# Patient Record
Sex: Male | Born: 2000
Health system: Southern US, Community
[De-identification: ages and names within clinical notes are randomized; demographics above are authoritative.]

---
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
## 2020-05-16 ENCOUNTER — Ambulatory Visit: Payer: Self-pay | Attending: Internal Medicine

## 2020-05-16 ENCOUNTER — Other Ambulatory Visit: Payer: Self-pay

## 2020-05-16 DIAGNOSIS — Z23 Encounter for immunization: Secondary | ICD-10-CM

## 2020-05-16 NOTE — Progress Notes (Signed)
   Covid-19 Vaccination Clinic  Name:  Jonathan Solis    MRN: [id number] DOB: 2001/11/28  05/16/2020  [name] was observed post Covid-19 immunization for 15 minutes without incident. He was provided with Vaccine Information Sheet and instruction to access the V-Safe system.   [name] was instructed to call 911 with any severe reactions post vaccine: Marland Kitchen Difficulty breathing  . Swelling of face and throat  . A fast heartbeat  . A bad rash all over body  . Dizziness and weakness   Immunizations Administered    Name Date Dose VIS Date Route   Pfizer COVID-19 Vaccine 05/16/2020  6:16 PM 0.3 mL 02/16/2019 Intramuscular   Manufacturer: [employer], [employer]   Lot: [id number]   NDC: [address]-3

## 2020-06-06 ENCOUNTER — Ambulatory Visit: Payer: Self-pay | Attending: Internal Medicine

## 2020-06-06 DIAGNOSIS — Z23 Encounter for immunization: Secondary | ICD-10-CM

## 2020-06-06 NOTE — Progress Notes (Signed)
   Covid-19 Vaccination Clinic  Name:  Jonathan Solis    MRN: [id number] DOB: 2001-10-10  06/06/2020  [name] was observed post Covid-19 immunization for 15 minutes without incident. He was provided with Vaccine Information Sheet and instruction to access the V-Safe system.   [name] was instructed to call 911 with any severe reactions post vaccine: Marland Kitchen Difficulty breathing  . Swelling of face and throat  . A fast heartbeat  . A bad rash all over body  . Dizziness and weakness   Immunizations Administered    Name Date Dose VIS Date Route   Pfizer COVID-19 Vaccine 06/06/2020  5:17 PM 0.3 mL 02/16/2019 Intramuscular   Manufacturer: [employer], [employer]   Lot: [id number]   NDC: [address]-7

## 2021-05-01 DIAGNOSIS — G8929 Other chronic pain: Secondary | ICD-10-CM | POA: Diagnosis not present

## 2021-05-01 DIAGNOSIS — Z13220 Encounter for screening for lipoid disorders: Secondary | ICD-10-CM | POA: Diagnosis not present

## 2021-05-01 DIAGNOSIS — M25561 Pain in right knee: Secondary | ICD-10-CM | POA: Diagnosis not present

## 2021-05-01 DIAGNOSIS — Z131 Encounter for screening for diabetes mellitus: Secondary | ICD-10-CM | POA: Diagnosis not present

## 2021-05-01 DIAGNOSIS — Z0001 Encounter for general adult medical examination with abnormal findings: Secondary | ICD-10-CM | POA: Diagnosis not present

## 2021-05-10 DIAGNOSIS — M25561 Pain in right knee: Secondary | ICD-10-CM | POA: Diagnosis not present

## 2021-05-29 ENCOUNTER — Other Ambulatory Visit: Payer: Self-pay

## 2021-05-29 ENCOUNTER — Ambulatory Visit: Payer: 59 | Attending: Orthopedic Surgery

## 2021-05-29 DIAGNOSIS — M25561 Pain in right knee: Secondary | ICD-10-CM | POA: Diagnosis not present

## 2021-05-29 NOTE — Therapy (Deleted)
[place] [facility] PHYSICAL AND SPORTS MEDICINE [address], [place], [address] Phone: [phone]   Fax:  [phone]  Physical Therapy Evaluation  Patient Details  Name: Jonathan Solis MRN: [id number] Date of Birth: 06/19/2001 Referring Provider (PT): Lasandra Beech   Encounter Date: 05/29/2021    History reviewed. No pertinent past medical history.  History reviewed. No pertinent surgical history.  There were no vitals filed for this visit.    Subjective Assessment - 05/29/21 1350    Pertinent History Pt is a 20 y.o. male referred to PT for acute to subacute R knee pain that has been going on for the last few weeks. No pertinent PMH. Pain located on lateral aspect of his knee described as sharp that is worsened with weightbearing, knee AROM, and running. Unable to run > 1/4 mile due to pain. Popping of R knee reported but no locking or giving way. Has imaging of R knee with no concerns of fractures or other abnormalities. Pt initially denies previous hx of knee pain. Pain began May 8th training for half marathon when running ~8 miles. Pt reports running with R foot externally rotated and is wondering if his running mechanics is effecting it. Rest has been helpful and pain has been improving. When pain was at its worst, stopped running and elevated which helped. Pain located on lat aspect outside of patella. Described as sharp pain during swing phase of gait and terminal stance with push off. Worst pain: 8/10, current pain: 0/10, best pain:  0/10. Pt reports taking close to whole day to reduce from 8/10. Painful movements also with lateral lunges and movements. Pt's main goal with PT is to find exercises above and below knee.    Currently in Pain? No/denies              [facility] PT Assessment - 05/29/21 0001      Assessment   Medical Diagnosis R knee pain    Referring Provider (PT) Lasandra Beech    Onset Date/Surgical Date 04/29/21    Next  MD Visit None      Restrictions   Weight Bearing Restrictions No      Prior Function   Level of Independence Independent    Vocation Student    Vocation Requirements school/work full time      Cognition   Overall Cognitive Status Within Functional Limits for tasks assessed            PAIN: 0/10 NPS  POSTURE:   PROM/AROM:    AROM BLE:  Hip flexion:  Knee flexion:  Knee extension:   Accessory Motions:    STRENGTH:  Graded on a 0-5 scale Muscle Group Left Right  Shoulder flex /5 /5  Shoulder Abd /5 /5  Shoulder Ext /5 /5  Shoulder IR/ER /5 /5  Elbow /5 /5  Wrist/hand /5 /5  Hip Flex 5/5 5/5  Hip Abd 5/5 5/5  Hip Add 5/5 5/5  Hip Ext    Hip IR/ER 5/5 5/5  Knee Flex 5/5 5/5  Knee Ext 5/5 5/5  Ankle DF 5/5 5/5  Ankle PF 5/5 5/5   SENSATION:  BUE :  BLE :   NEUROLOGICAL SCREEN: (2+ unless otherwise noted.) N=normal  Ab=abnormal   Level Dermatome R L  C3 Anterior Neck  N N  C4 Top of Shoulder N N  C5 Lateral Upper Arm  N N  C6 Lateral Arm/ Thumb  N N  C7 Middle Finger  N  N  C8 4th & 5th Finger N N  T1 Medial Arm N N  L2 Medial thigh/groin N N  L3 Lower thigh/med.knee N N  L4 Medial leg/lat thigh N N  L5 Lat. leg & dorsal foot N N  S1 post/lat foot/thigh/leg N N  S2 Post./med. thigh & leg N N    SOMATOSENSORY:  Any N & T in extremities or weakness: reports :         Sensation           Intact      Diminished         Absent  Light touch LEs                              COORDINATION: Finger to Nose: Dysmetric ... Past Pointing:  ... Slow speed and moderate pass pointing ...  intact without pass pointing, but slow speed ...         Heel Shin Slide Test:   SPECIAL TESTS: Cranial Nerves/Vision   FUNCTIONAL MOBILITY:   BALANCE: Static Sitting Balance  Normal Able to maintain balance against maximal resistance   Good Able to maintain balance against moderate resistance   Good-/Fair+ Accepts minimal resistance   Fair Able to sit  unsupported without balance loss and without UE support   Poor+ Able to maintain with Minimal assistance from individual or chair   Poor Unable to maintain balance-requires mod/max support from individual or chair    Static Standing Balance  Normal Able to maintain standing balance against maximal resistance   Good Able to maintain standing balance against moderate resistance   Good-/Fair+ Able to maintain standing balance against minimal resistance   Fair Able to stand unsupported without UE support and without LOB for 1-2 min   Fair- Requires Min A and UE support to maintain standing without loss of balance   Poor+ Requires mod A and UE support to maintain standing without loss of balance   Poor Requires max A and UE support to maintain standing balance without loss    Dynamic Sitting Balance  Normal Able to sit unsupported and weight shift across midline maximally   Good Able to sit unsupported and weight shift across midline moderately   Good-/Fair+ Able to sit unsupported and weight shift across midline minimally   Fair Minimal weight shifting ipsilateral/front, difficulty crossing midline   Fair- Reach to ipsilateral side and unable to weight shift   Poor + Able to sit unsupported with min A and reach to ipsilateral side, unable to weight shift   Poor Able to sit unsupported with mod A and reach ipsilateral/front-can't cross midline    Standing Dynamic Balance  Normal Stand independently unsupported, able to weight shift and cross midline maximally   Good Stand independently unsupported, able to weight shift and cross midline moderately   Good-/Fair+ Stand independently unsupported, able to weight shift across midline minimally   Fair Stand independently unsupported, weight shift, and reach ipsilaterally, loss of balance when crossing midline   Poor+ Able to stand with Min A and reach ipsilaterally, unable to weight shift   Poor Able to stand with Mod A and minimally reach  ipsilaterally, unable to cross midline.      GAIT:  OUTCOME MEASURES: TEST Outcome Interpretation  5 times sit<>stand sec >60 yo, >15 sec indicates increased risk for falls  10 meter walk test  m/s <1.0 m/s indicates increased risk for falls; limited community ambulator  Timed up and Go                 sec <14 sec indicates increased risk for falls  6 minute walk test                Feet 1000 feet is community Financial controller  <36/56 (100% risk for falls), 37-45 (80% risk for falls); 46-51 (>50% risk for falls); 52-55 (lower risk <25% of falls)  FOTO                Objective measurements completed on examination: See above findings.                             Patient will benefit from skilled therapeutic intervention in order to improve the following deficits and impairments:     Visit Diagnosis: No diagnosis found.     Problem List There are no problems to display for this patient.   Delphia Grates Fairly, IV 05/29/2021, 1:56 PM  [place] [facility] PHYSICAL AND SPORTS MEDICINE [address], [place], [address] Phone: [phone]   Fax:  [phone]  Name: Jonathan Solis MRN: [id number] Date of Birth: [birth year]

## 2021-05-29 NOTE — Therapy (Signed)
[facility]ICINE [address] [place], [address] Phone: [phone]   Fax:  [phone]  Physical Therapy Evaluation  Patient Details  Name: JULIS HAUBNER MRN: [id number] Date of Birth: [birth year] Referring Provider (PT): Lasandra Beech   Encounter Date: 05/29/2021   PT End of Session - 05/29/21 2025    Visit Number 1    Number of Visits 17    Date for PT Re-Evaluation 07/24/21    PT Start Time 1345    PT Stop Time 1430    PT Time Calculation (min) 45 min    Activity Tolerance Patient tolerated treatment well    Behavior During Therapy [facility] for tasks assessed/performed           History reviewed. No pertinent past medical history.  History reviewed. No pertinent surgical history.  There were no vitals filed for this visit.    Subjective Assessment - 05/29/21 1350    Subjective Pt is a 20 y.o. male referred to PT for acute R knee pain exacerbated with running. 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 Noticed pain with half marathon training at 8 mile mark.    Pertinent History Pt is a 20 y.o. male referred to PT for acute to subacute R knee pain that has been going on for the last few weeks. No pertinent PMH. Pain located on lateral aspect of his knee described as sharp that is worsened with weightbearing, knee AROM, and running. Unable to run > 1/4 mile due to pain. Popping of R knee reported but no locking or giving way. Has imaging of R knee with no concerns of fractures or other abnormalities. Pt initially denies previous hx of knee pain. Pain began May 8th training for half marathon when running ~8 miles. Pt reports running with R foot externally rotated and is wondering if his running mechanics is effecting it. Rest has been helpful and pain has been improving. When pain was at its worst, stopped running and elevated which helped. Pain located  on lat aspect outside of patella. Described as sharp pain during swing phase of gait and terminal stance with push off. Worst pain: 8/10, current pain: 0/10, best pain:  0/10. Pt reports taking close to whole day to reduce from 8/10. Painful movements also with lateral lunges and movements. Pt's main goal with PT is to find exercises above and below knee.    Limitations Standing;Other (comment)   Running   How long can you sit comfortably? Unlimited    How long can you stand comfortably? As long as needed    How long can you walk comfortably? Unlimited    Diagnostic tests X-ray    Patient Stated Goals Improve LE flexibility, pain, be able to run pain free    Currently in Pain? No/denies              [facility] PT Assessment - 05/29/21 0001      Assessment   Medical Diagnosis R knee pain    Referring Provider (PT) Lasandra Beech    Onset Date/Surgical Date 04/29/21    Next MD Visit None      Restrictions   Weight Bearing Restrictions No      Prior Function   Level of Independence Independent    Vocation Student    Vocation Requirements school/work full time      Cognition   Overall Cognitive  Status Within Functional Limits for tasks assessed            OBJECTIVE  MUSCULOSKELETAL: Tremor: Absent Bulk: Normal Tone: Normal, no spasticity, rigidity, or clonus No trophic changes noted to lower extremities. No ecchymosis, erythema, or edema noted around knee. No gross knee deformity noted  Posture No gross abnormalities noted in standing or seated posture  Lumbar/Hip AROM: WFL and painless with overpressure in all planes  Gait No gross deficits in gait identified  Palpation TTP along lat aspect of R patella. Post running gait assessment pt reports TTP at patellar tendon.  Strength R/L 5/5 Hip flexion 5/5 Hip external rotation 5/5 Hip internal rotation 5/5 Hip abduction 5/5 Hip adduction 5/5 Hip extension 5/5 Knee extension 5/5 Knee flexion 5/5 Ankle  Dorsiflexion 5/5 Ankle Plantarflexion 5/5 Ankle Inversion 5/5 Ankle Eversion 5/5 R hip TFL *indicates pain  AROM  Knee R/L Flexion: 130/130  Extension: 0/0 *indicates pain  Hip R/L Flexion: WFL Extension: WFL Abduction: WFL *indicates pain  Ankle R/L WNL Ankle Plantarflexion WNL Ankle Dorsiflexion WNL Ankle Inversion WNL Ankle Eversion *Indicates Pain  Muscle Length Hamstring length: Limited. < 90 degrees bilaterally. Quad length [facility]): Negative. Hip Flexor length Maisie Fus): Concordant pain on RLE for rec fem bias. IT band length Claiborne Rigg): Negative  Passive Accessory Motion Superior Tibiofibular Joint: WNL Knee: WNL Patella: WNL   NEUROLOGICAL:  Mental Status Patient's fund of knowledge is within normal limits for educational level.  Sensation Grossly intact to light touch bilateral LEs as determined by testing dermatomes L2-S2 Proprioception and hot/cold testing deferred on this date   SPECIAL TESTS  Ligamentous Stability  Anterior Drawer Test: NEGATIVE Lachman Test: NEGATIVE Posterior Drawer Test: NEGATIVE Valgus Stress Test: NEGATIVE Varus Stress Test: NEGATIVE  Meniscus Tests  McMurray Test: NEGATIVE SPECIAL TESTS   Ligamentous Integrity Anterior Drawer: NEGATIVE Posterior Drawer: NEGATIVE  FUNCTIONAL TESTS  Sit to stand: NEGATIVE Deep squat: NEGATIVE   Running Gait Assessment Pt runs with consistent cadence. Noted heel strike bilaterally with intermittent mid foot striking. Pt through stance and push off phase is supinated throughout. Equal hip extension and flight phase in BLE's. Pain improved after cuing to push through big toes and perform initial contact with toe strike.     Objective measurements completed on examination: See above findings.               PT Education - 05/29/21 2024    Education Details POC. HEP. Running gait mechanics adjustments.    Person(s) Educated Patient    Methods  Explanation;Demonstration;Tactile cues;Verbal cues;Handout    Comprehension Verbalized understanding            PT Short Term Goals - 05/29/21 2034      PT SHORT TERM GOAL #1   Title Pt will be indep with HEP to improve flexibility and pain with running.    Baseline 6/7: initiated    Time 4    Period Weeks    Status New    Target Date 06/26/21      PT SHORT TERM GOAL #2   Title Pt will complete 1 mile of running without experiencing knee pain > 2/10 NPS.    Baseline 6/7: Up to 8/10 NPS with running    Time 2    Period Weeks    Status New    Target Date 06/12/21             PT Long Term Goals - 05/29/21 2036      PT LONG  TERM GOAL #1   Title Pt will improve FOTO to target score to display improvements in functional mobility.    Baseline 6/7: 65/86    Time 8    Period Weeks    Status New    Target Date 07/24/21      PT LONG TERM GOAL #2   Title Pt will complete 4 miles of running without experiencing an increase in R knee pain.    Baseline 6/7: Up to 8/10 pain NPS.    Time 8    Period Weeks    Status New    Target Date 07/24/21                  Plan - 05/29/21 2025    Clinical Impression Statement Pt is a 20 y.o. male with acute R knee pain with no significant PMH. Pt has TTP at lateral patella near joint line and post running is TTP at patellar tendon that was not appreciated until end of session. All meniscul, ACL, PCL, LCL, MCL testing negative. Pt has full strength in BLE's. Positive THomas test noted bilaterally with rec fem bias with concordant pain and also noted limited hamstring mobility and B hip ER/IR tightness with lat lean from pt to compensate along with calf tightness. Pt displays pain free squat and lateral lunge with no concordant symptoms. Running gait assessed on treadmill. Pt appears to land with heel strike to mid foot bilaterally and runs with supinated feet. Equal strides and consistent cadence appreciated. With cuing to push off through  big toes and run on balls of feet pt does reports light improvement in R knee pain. These impairments are limiting pt to perform leisure and recreational running tasks along with prolonged standing needed for machinist school. Pt will benefit from skilled PT services to address pain, LE flexibility, and running mechanics so pt can return to PLOF pain free.    Personal Factors and Comorbidities Age;Education;Profession;Past/Current Experience;Fitness;Time since onset of injury/illness/exacerbation    Examination-Activity Limitations Locomotion Level   Running   Examination-Participation Restrictions School;Occupation    Stability/Clinical Decision Making Stable/Uncomplicated    Clinical Decision Making Low    Rehab Potential Good    PT Frequency 2x / week    PT Duration 8 weeks    PT Treatment/Interventions ADLs/Self Care Home Management;Aquatic Therapy;Cryotherapy;Electrical Stimulation;Iontophoresis 4mg /ml Dexamethasone;Moist Heat;Traction;Ultrasound;Gait training;Stair training;Functional mobility training;Therapeutic activities;Patient/family education;Therapeutic exercise;Balance training;Neuromuscular re-education;Passive range of motion;Dry needling;Manual techniques;Spinal Manipulations;Joint Manipulations    PT Next Visit Plan Reassess HEP, running mechanics, hip flexibility.    PT Home Exercise Plan Access Code:    Consulted and Agree with Plan of Care Patient           [facility] PT Assessment - 05/29/21 0001      Assessment   Medical Diagnosis R knee pain    Referring Provider (PT) 07/29/21    Onset Date/Surgical Date 04/29/21    Next MD Visit None      Restrictions   Weight Bearing Restrictions No      Prior Function   Level of Independence Independent    Vocation Student    Vocation Requirements school/work full time      Cognition   Overall Cognitive Status Within Functional Limits for tasks assessed          Patient will benefit from skilled therapeutic  intervention in order to improve the following deficits and impairments:  Pain,Improper body mechanics,Decreased mobility,Impaired flexibility  Visit Diagnosis: Acute pain of right knee  Problem List There are no problems to display for this patient.   Delphia GratesMilton M. Fairly IV, PT, DPT Physical Therapist- [place]  [facility]  05/29/2021, 8:48 PM  [facility] [address] [place], [place], [address] Phone: [phone]   Fax:  [phone]  Name: Tiana Loftzekiel C Wymore MRN: [id number] Date of Birth: 01/29/2001

## 2021-05-31 ENCOUNTER — Other Ambulatory Visit: Payer: Self-pay

## 2021-05-31 ENCOUNTER — Ambulatory Visit: Payer: 59

## 2021-05-31 DIAGNOSIS — M25561 Pain in right knee: Secondary | ICD-10-CM | POA: Diagnosis not present

## 2021-05-31 NOTE — Therapy (Signed)
[facility]TS MEDICINE [address], [place], [address] Phone: [phone]   Fax:  [phone]  Physical Therapy Treatment  Patient Details  Name: Jonathan Solis MRN: [id number] Date of Birth: [birth year] Referring Provider (PT): Lasandra Beech   Encounter Date: 05/31/2021   PT End of Session - 05/31/21 1455     Visit Number 2    Number of Visits 17    Date for PT Re-Evaluation 07/24/21    PT Start Time 1430    PT Stop Time 1513    PT Time Calculation (min) 43 min    Activity Tolerance Patient tolerated treatment well;No increased pain    Behavior During Therapy [facility] for tasks assessed/performed             History reviewed. No pertinent past medical history.  History reviewed. No pertinent surgical history.  There were no vitals filed for this visit.   Subjective Assessment - 05/31/21 1446     Subjective Pt is a 20 yo reports that he experienced increased pain in right knee after doing lateral lunges and forward lunges with right knee being in back of left knee.    Currently in Pain? Yes    Pain Score 4     Pain Location Knee    Pain Orientation Right    Pain Descriptors / Indicators Aching    Pain Type Acute pain    Pain Onset 1 to 4 weeks ago    Pain Frequency Intermittent    Aggravating Factors  Exercising    Pain Relieving Factors Rest    Effect of Pain on Daily Activities Limits exercises    Multiple Pain Sites No             THEREX   Pigeon Stretch 3 x 30 sec/LE  Half Kneel Stretch with foot on wall 3 x 30 sec/LE Supine Hamstring with Strap 3 x 30 sec/LE  -VCs to position leg   RLE single leg squats with emphasis on eccentric control: 1x8 Seated RLE LAQ eccentric into GTB: x10. VC's for prolonged eccentric loading  Gait Mechanics with running on treadmill:  Settings: 7 miles per hour, 8 min  -VCs to increase forefoot strike due to continuous heel strike placing increased load on  knee joint.  -VCs to increase forward lean   Pt continues to report R knee pain up to 4/10 NPS with no changes in pain with running gait modifications.    Pt educated on reducing LE loading with runs and gym work outs for pain and inflammation to subside. Pt educated on possible benefits of ice massage localized to patellar tendon for 3-5 min bouts as needed when experiencing pain.   PT Education - 05/31/21 1452     Education Details Educated on home exercise plan    Person(s) Educated Patient    Methods Verbal cues;Explanation;Demonstration              PT Short Term Goals - 05/29/21 2034       PT SHORT TERM GOAL #1   Title Pt will be indep with HEP to improve flexibility and pain with running.    Baseline 6/7: initiated    Time 4    Period Weeks    Status New    Target Date 06/26/21      PT SHORT TERM GOAL #2   Title Pt will complete 1 mile of running without experiencing knee pain > 2/10 NPS.  Baseline 6/7: Up to 8/10 NPS with running    Time 2    Period Weeks    Status New    Target Date 06/12/21               PT Long Term Goals - 05/29/21 2036       PT LONG TERM GOAL #1   Title Pt will improve FOTO to target score to display improvements in functional mobility.    Baseline 6/7: 65/86    Time 8    Period Weeks    Status New    Target Date 07/24/21      PT LONG TERM GOAL #2   Title Pt will complete 4 miles of running without experiencing an increase in R knee pain.    Baseline 6/7: Up to 8/10 pain NPS.    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 Pt has signs and symptoms indicative of patellar tendinopathy with increased pain well localized to lower patellar tendon, decreased quad and hamstring activity, and that is elicited with physical activity.  He  tolerated session well with no increase in pain during strengthening and stretchning exercises. Pt did experience increase in right knee toward end of running activity which was likely due to increased heel strike, right leg external rotation, and decreased anterior trunk lean. Pt instructed on proper running techniques and to decrease LE activity. He will continue benefit from PT to address muscle length deficits, pain with activity, and running mechanics, so that will be able to engage recreational physical activity without being limited by pain.    Personal Factors and Comorbidities Age;Education;Profession;Past/Current Experience;Fitness;Time since onset of injury/illness/exacerbation    Examination-Activity Limitations Locomotion Level   Running   Examination-Participation Restrictions School;Occupation    Stability/Clinical Decision Making Stable/Uncomplicated    Rehab Potential Good    PT Frequency 2x / week    PT Duration 8 weeks    PT Treatment/Interventions ADLs/Self Care Home Management;Aquatic Therapy;Cryotherapy;Electrical Stimulation;Iontophoresis 4mg /ml Dexamethasone;Moist Heat;Traction;Ultrasound;Gait training;Stair training;Functional mobility training;Therapeutic activities;Patient/family education;Therapeutic exercise;Balance training;Neuromuscular re-education;Passive range of motion;Dry needling;Manual techniques;Spinal Manipulations;Joint Manipulations    PT Next Visit Plan Reassess HEP, Assess Running Mechanics, Progress Eccentric Exercises    PT Home Exercise Plan Access Code:    Consulted and Agree with Plan of Care Patient             Visit Diagnosis: Acute pain of right knee   Problem List There are no problems to display for this patient.  [email or web address] PT, DPT  Physical Therapist- [facility]  05/31/2021, 5:01 PM  [facility] PHYSICAL AND SPORTS MEDICINE [address], [address], [place] Phone: [phone]   Fax:  [phone]  Name: Jonathan Solis MRN: [name] Date of Birth: [birth year]

## 2021-06-05 ENCOUNTER — Encounter: Payer: Self-pay | Admitting: Physical Therapy

## 2021-06-05 ENCOUNTER — Ambulatory Visit: Payer: 59 | Admitting: Physical Therapy

## 2021-06-05 DIAGNOSIS — M25561 Pain in right knee: Secondary | ICD-10-CM | POA: Diagnosis not present

## 2021-06-05 NOTE — Therapy (Signed)
[facility] PH[facility]ORTS MEDICINE [address] [place], [address] Phone: [phone]   Fax:  [phone]  Physical Therapy Treatment  Patient Details  Name: Jonathan Solis MRN: [id number] Date of Birth: Jan 14, 2001 Referring Provider (PT): Lasandra Beech   Encounter Date: 06/05/2021   PT End of Session - 06/05/21 1505     Visit Number 3    Number of Visits 17    Date for PT Re-Evaluation 07/24/21    PT Start Time 1415    PT Stop Time 1500    PT Time Calculation (min) 45 min    Activity Tolerance Patient tolerated treatment well;No increased pain    Behavior During Therapy [facility] for tasks assessed/performed             History reviewed. No pertinent past medical history.  History reviewed. No pertinent surgical history.  There were no vitals filed for this visit.   Subjective Assessment - 06/05/21 1416     Subjective Pt reports that he is still experiencing pain in his right knee around knee cap. He has descreased his activity. He experiencings most of his pain in right joint line. Experiences pain now with walking playing disc golf over uneven terrain or when lunging backwards.    Patient Stated Goals To be able to complete sports exercises and running long distances without experiencing pain.    Currently in Pain? Yes    Pain Score 2     Pain Location Knee    Pain Orientation Right    Pain Descriptors / Indicators Aching    Pain Type Acute pain    Pain Onset 1 to 4 weeks ago             THEREX:  Knee Extension #35 with emphasis on eccentric phase 2 x 10   Knee Side Step Ups off 6 inch step 2 x10  -Slight increase in pain with knee extension   Supine TFL Stretch 5 x 30 sec   Sidelying Foam Roller of TFL 5 x 30 sec   Standing Calf Stretch 30 sec  -Educated patient on alternative stretch        PT Short Term Goals - 06/05/21 1507       PT SHORT TERM GOAL #1   Title Pt will be indep with HEP to  improve flexibility and pain with running.    Baseline 6/7: initiated    Time 4    Period Weeks    Status New    Target Date 06/26/21      PT SHORT TERM GOAL #2   Title Pt will complete 1 mile of running without experiencing knee pain > 2/10 NPS.    Baseline 6/7: Up to 8/10 NPS with running    Time 2    Period Weeks    Status New    Target Date 06/12/21               PT Long Term Goals - 06/05/21 1507       PT LONG TERM GOAL #1   Title Pt will improve FOTO to target score to display improvements in functional mobility.    Baseline 6/7: 65/86    Time 8    Period Weeks    Status New      PT LONG TERM GOAL #2   Title Pt will complete 4 miles of running without experiencing an increase in R knee pain.    Baseline 6/7: Up to  8/10 pain NPS.    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 Pt continues to feel pain with certain activities particularly when walking over uneven surfaces. He tolerated most exercises without increase in pain with exception of lateral step ups, but this was only slight increase in pain within lateral joint line. Noted during session is soft tissue on pt's lateral joint line when he performs knee extension and this occurs on both his knees. Pt will continue to benefit from PT to progress his knee strength and mobility, so that he can resume normal running activities without experiencing an increase in his pain.    Personal Factors and Comorbidities Age;Education;Profession;Past/Current Experience;Fitness;Time since onset of injury/illness/exacerbation    Examination-Activity Limitations Locomotion Level   Running   Examination-Participation Restrictions School;Occupation    Stability/Clinical Decision Making Stable/Uncomplicated    Rehab Potential Good    PT Frequency 2x / week     PT Duration 8 weeks    PT Treatment/Interventions ADLs/Self Care Home Management;Aquatic Therapy;Cryotherapy;Electrical Stimulation;Iontophoresis 4mg /ml Dexamethasone;Moist Heat;Traction;Ultrasound;Gait training;Stair training;Functional mobility training;Therapeutic activities;Patient/family education;Therapeutic exercise;Balance training;Neuromuscular re-education;Passive range of motion;Dry needling;Manual techniques;Spinal Manipulations;Joint Manipulations    PT Next Visit Plan Assess pain tolerance to energy loading exercises    PT Home Exercise Plan Access Code:    Consulted and Agree with Plan of Care Patient             HEP includes following exercises:  Access Code: [email or web address] URL: [email or web address] Date: 06/05/2021 Prepared by: 06/07/2021  Exercises Half Kneeling Hip Flexor Stretch - 2 x daily - 7 x weekly - 2 sets - 2 reps - 30 hold Gastroc Stretch on Wall - 2 x daily - 7 x weekly - 2 sets - 2 reps - 30 hold Long Sitting Calf Stretch with Strap - 2 x daily - 7 x weekly - 2 sets - 2 reps - 30 hold Pigeon Stretch with TRX - 1 x daily - 7 x weekly - 2 sets - 2 reps - 30 hold Single Leg Squat with Chair Touch - 1 x daily - 3 x weekly - 2 sets - 8 reps Seated Knee Extension with Resistance - 1 x daily - 3 x weekly - 3 sets - 10 reps Sidelying TFL Stretch - 1 x daily - 7 x weekly - 1 sets - 5 reps - 30 hold Lateral Step Up - 1 x daily - 7 x weekly - 3 sets - 10 reps   Patient will benefit from skilled therapeutic intervention in order to improve the following deficits and impairments:  Pain, Improper body mechanics, Decreased mobility, Impaired flexibility  Visit Diagnosis: Acute pain of right knee     Problem List There are no problems to display for this patient.  Ellin Goodie 06/05/2021, 3:23 PM  06/07/2021 PT, DPT    [facility] PHYSICAL AND SPORTS MEDICINE [address], [place] Phone: [phone]   Fax:  [phone]  Name: Jonathan Solis MRN: [name] Date of Birth: December 14, 2001

## 2021-06-07 ENCOUNTER — Ambulatory Visit: Payer: 59 | Admitting: Physical Therapy

## 2021-06-12 ENCOUNTER — Encounter: Payer: Self-pay | Admitting: Physical Therapy

## 2021-06-12 ENCOUNTER — Other Ambulatory Visit: Payer: Self-pay

## 2021-06-12 ENCOUNTER — Ambulatory Visit: Payer: 59 | Admitting: Physical Therapy

## 2021-06-12 DIAGNOSIS — M25561 Pain in right knee: Secondary | ICD-10-CM | POA: Diagnosis not present

## 2021-06-12 NOTE — Therapy (Signed)
[place] [facility] PHYSICAL AND SPORTS MEDICINE [address], [place], [address] Phone: [phone]   Fax:  [phone]  Physical Therapy Treatment  Patient Details  Name: Jonathan Solis MRN: [id number] Date of Birth: 06-Feb-2001 Referring Provider (PT): Lasandra Beech   Encounter Date: 06/12/2021   PT End of Session - 06/12/21 1401     Visit Number 4    Number of Visits 17    Date for PT Re-Evaluation 07/24/21    PT Start Time 1330    PT Stop Time 1400    PT Time Calculation (min) 30 min    Activity Tolerance Patient tolerated treatment well;No increased pain    Behavior During Therapy [facility] for tasks assessed/performed             History reviewed. No pertinent past medical history.  History reviewed. No pertinent surgical history.  There were no vitals filed for this visit.   Subjective Assessment - 06/12/21 1328     Subjective Pt reports that he is still experiencing knee pain. He went for run and played disc golf. During disc golf, he walked 20,000 steps. He describes dailing it back, but he is still doing a significant amount of lower body activity. Pt explains that he tends to experience more pain with more activity. During run, he did not notice pain in knee during run just after run. He wonders how much he should push through pain with exercise.    Patient Stated Goals To be able to complete sports exercises and running long distances without experiencing pain.    Currently in Pain? No/denies    Pain Onset 1 to 4 weeks ago            THEREX:  Instruction and shared decision making on activity modification.  -Agreed to stop disc golf and to bike instead of run.   Right Knee Isometric 3 x 45 sec -TC to push knee into hand   Wall Squat on RLE 3 x 45 sec  -Vcs to maintain tension in quads      Updated HEP and educated patient on changes to exercises and addition of new exercises       PT Short Term Goals -  06/12/21 1400       PT SHORT TERM GOAL #1   Title Pt will be indep with HEP to improve flexibility and pain with running.    Baseline 6/7: initiated    Time 4    Period Weeks    Status New    Target Date 06/26/21      PT SHORT TERM GOAL #2   Title Pt will complete 1 mile of running without experiencing knee pain > 2/10 NPS.    Baseline 6/7: Up to 8/10 NPS with running    Time 2    Period Weeks    Status New    Target Date 06/12/21               PT Long Term Goals - 06/12/21 1401       PT LONG TERM GOAL #1   Title Pt will improve FOTO to target score to display improvements in functional mobility.    Baseline 6/7: 65/86    Time 8    Period Weeks    Status New      PT LONG TERM GOAL #2   Title Pt will complete 4 miles of running without experiencing an increase in R knee pain.  Baseline 6/7: Up to 8/10 pain NPS.    Time 8    Period Weeks    Status New                Plan - 06/12/21 1418     Clinical Impression Statement Pt continues to experience right knee pain over patellar tendon likely due to continued over activity. Pt is still exercising at a high volume despite recommendation to reduce physical activity. PT engaged with pt in developing a plan to modify activity by stopping disc golf and biking instead of running. Strengthening exercises were also changed from isotonic to isometric to decrease right knee overactivty and to begin patellar tendinopathy protocol before moving to isotonics. Pt is in agreement with plan of care, and he will continue to benefit from skilled PT to reduce right knee pain in order to resume running.    Personal Factors and Comorbidities Age;Education;Profession;Past/Current Experience;Fitness;Time since onset of injury/illness/exacerbation    Examination-Activity Limitations Locomotion Level   Running   Examination-Participation Restrictions School;Occupation    Stability/Clinical Decision Making Stable/Uncomplicated    Rehab  Potential Good    PT Frequency 2x / week    PT Duration 8 weeks    PT Treatment/Interventions ADLs/Self Care Home Management;Aquatic Therapy;Cryotherapy;Electrical Stimulation;Iontophoresis 4mg /ml Dexamethasone;Moist Heat;Traction;Ultrasound;Gait training;Stair training;Functional mobility training;Therapeutic activities;Patient/family education;Therapeutic exercise;Balance training;Neuromuscular re-education;Passive range of motion;Dry needling;Manual techniques;Spinal Manipulations;Joint Manipulations    PT Next Visit Plan Sequence and instruct on appropriate way to do exercises    PT Home Exercise Plan Access Code:    Consulted and Agree with Plan of Care Patient            Home exercises include:  Access Code: [email or web address] URL: [email or web address] Date: 06/12/2021 Prepared by: 06/14/2021  Exercises Half Kneeling Hip Flexor Stretch - 2 x daily - 7 x weekly - 2 sets - 2 reps - 30 hold Gastroc Stretch on Wall - 2 x daily - 7 x weekly - 2 sets - 2 reps - 30 hold Long Sitting Calf Stretch with Strap - 2 x daily - 7 x weekly - 2 sets - 2 reps - 30 hold Pigeon Stretch with TRX - 1 x daily - 7 x weekly - 2 sets - 2 reps - 30 hold Sidelying TFL Stretch - 1 x daily - 7 x weekly - 1 sets - 5 reps - 30 hold Supine Quad Set - 1 x daily - 7 x weekly - 3 sets - 5 reps - 45 sec hold Single Leg Quarter Squat with Swiss Ball at Ellin Goodie - 1 x daily - 7 x weekly - 3 sets - 5 reps - 45 sec hold    Patient will benefit from skilled therapeutic intervention in order to improve the following deficits and impairments:  Pain, Improper body mechanics, Decreased mobility, Impaired flexibility  Visit Diagnosis: Acute pain of right knee     Problem List There are no problems to display for this patient.  [employer] PT, DPT  06/12/2021, 2:27 PM  [facility] PHYSICAL AND SPORTS MEDICINE [address], [address], [place] Phone:  [phone]   Fax:  [phone]  Name: Jonathan Solis MRN: [name] Date of Birth: 06-10-2001

## 2021-06-14 ENCOUNTER — Ambulatory Visit: Payer: 59 | Admitting: Physical Therapy

## 2021-06-19 ENCOUNTER — Ambulatory Visit: Payer: 59 | Admitting: Physical Therapy

## 2021-06-19 DIAGNOSIS — M25561 Pain in right knee: Secondary | ICD-10-CM

## 2021-06-19 NOTE — Therapy (Signed)
[place] [facility] PHYSICAL AND SPORTS MEDICINE [address] [place], [address] Phone: [phone]   Fax:  [phone]  Physical Therapy Treatment  Patient Details  Name: ROGELIO WAYNICK MRN: [id number] Date of Birth: 30-Sep-2001 Referring Provider (PT): Lasandra Beech   Encounter Date: 06/19/2021   PT End of Session - 06/19/21 1349     Visit Number 5    Number of Visits 17    Date for PT Re-Evaluation 07/24/21    PT Start Time 1330    PT Stop Time 1415    PT Time Calculation (min) 45 min    Activity Tolerance Patient tolerated treatment well;No increased pain    Behavior During Therapy [facility] for tasks assessed/performed             No past medical history on file.  No past surgical history on file.  There were no vitals filed for this visit.   Subjective Assessment - 06/19/21 1333     Subjective Pt reports that his knee pain has been feeling better as of late. He is using a resistance bike for aerobic activity and cut back on playing frisbee golf.    Patient Stated Goals To be able to complete sports exercises and running long distances without experiencing pain.    Currently in Pain? No/denies    Pain Onset 1 to 4 weeks ago            THEREX:  Single Leg Press #105 2 x 15   Single Leg Knee Extension #35 2 x 15   Bulgarian Split Squat #30 2 x 15  -Vcs to maintain toes and knees pointed forward   PT Short Term Goals - 06/19/21 1346       PT SHORT TERM GOAL #1   Title Pt will be indep with HEP to improve flexibility and pain with running.    Baseline 6/7: initiated    Time 4    Period Weeks    Status New    Target Date 06/26/21      PT SHORT TERM GOAL #2   Title Pt will complete 1 mile of running without experiencing knee pain > 2/10 NPS.    Baseline 6/7: Up to 8/10 NPS with running    Time 2    Period Weeks    Status New    Target Date 06/12/21               PT Long Term Goals - 06/19/21 1346        PT LONG TERM GOAL #1   Title Pt will improve FOTO to target score to display improvements in functional mobility.    Baseline 6/7: 65/86    Time 8    Period Weeks    Status New      PT LONG TERM GOAL #2   Title Pt will complete 4 miles of running without experiencing an increase in R knee pain.    Baseline 6/7: Up to 8/10 pain NPS.    Time 8    Period Weeks    Status New            Access Code: [email or web address] URL: [email or web address] Date: 06/19/2021 Prepared by: Ellin Goodie  Exercises Half Kneeling Hip Flexor Stretch - 2 x daily - 7 x weekly - 2 sets - 2 reps - 30 hold Gastroc Stretch on Wall - 2 x daily - 7 x weekly - 2 sets - 2 reps - 30 hold Long  Sitting Calf Stretch with Strap - 2 x daily - 7 x weekly - 2 sets - 2 reps - 30 hold Pigeon Stretch with TRX - 1 x daily - 7 x weekly - 2 sets - 2 reps - 30 hold Sidelying TFL Stretch - 1 x daily - 7 x weekly - 1 sets - 5 reps - 30 hold Supine Quad Set - 1 x daily - 7 x weekly - 3 sets - 5 reps - 45 sec hold Single Leg Quarter Squat with Swiss Ball at [employer] - 1 x daily - 7 x weekly - 3 sets - 5 reps - 45 sec hold Single Leg Knee Extension with Weight Machine - 1 x daily - 2 x weekly - 2 sets - 15 reps Single Leg Press - 1 x daily - 2 x weekly - 2 sets - 15 reps  Single Leg Split Squat with 30 lb weight 2 x 15 x 2 days per week        Plan - 06/19/21 1411     Clinical Impression Statement Pt presents for follow-up for right pain secondary to right knee patellar tendinopathy. He demonstrates decreased pain from prior week's activity modification. Pt able to progress to isotonic strengthening phase of protocol from isometric without increasing his right knee pain. He was limited to 2 sets instead of 3 sets because of right quadriceps muscle fatigue. He will benefit from skilled PT to progress right knee strength exercises in order to decrease right knee pain and to return to distance running without experiencing  increased pain.    Personal Factors and Comorbidities Age;Education;Profession;Past/Current Experience;Fitness;Time since onset of injury/illness/exacerbation    Examination-Activity Limitations Locomotion Level   Running   Examination-Participation Restrictions School;Occupation    Stability/Clinical Decision Making Stable/Uncomplicated    Rehab Potential Good    PT Frequency 2x / week    PT Duration 8 weeks    PT Treatment/Interventions ADLs/Self Care Home Management;Aquatic Therapy;Cryotherapy;Electrical Stimulation;Iontophoresis 4mg /ml Dexamethasone;Moist Heat;Traction;Ultrasound;Gait training;Stair training;Functional mobility training;Therapeutic activities;Patient/family education;Therapeutic exercise;Balance training;Neuromuscular re-education;Passive range of motion;Dry needling;Manual techniques;Spinal Manipulations;Joint Manipulations    PT Next Visit Plan Sequence and instruct on appropriate way to do exercises    PT Home Exercise Plan Access Code:    Consulted and Agree with Plan of Care Patient             Patient will benefit from skilled therapeutic intervention in order to improve the following deficits and impairments:  Pain, Improper body mechanics, Decreased mobility, Impaired flexibility  Visit Diagnosis: Acute pain of right knee  Problem List There are no problems to display for this patient.  [id number] PT, DPT  06/19/2021, 2:19 PM  [place] [facility] PHYSICAL AND SPORTS MEDICINE [address], [address], [place] Phone: [phone]   Fax:  [phone]  Name: KASHIF POOLER MRN: [name] Date of Birth: 2001-04-01

## 2021-06-21 ENCOUNTER — Ambulatory Visit: Payer: 59 | Admitting: Physical Therapy

## 2021-06-21 DIAGNOSIS — M25561 Pain in right knee: Secondary | ICD-10-CM | POA: Diagnosis not present

## 2021-06-21 NOTE — Therapy (Signed)
[place] [facility] PHYSICAL AND SPORTS MEDICINE [address] [place], [address] Phone: [phone]   Fax:  [phone]  Physical Therapy Treatment  Patient Details  Name: Jonathan Solis MRN: [id number] Date of Birth: 09/30/2001 Referring Provider (PT): Jonathan Solis   Encounter Date: 06/21/2021   PT End of Session - 06/22/21 0947     Visit Number 6    Number of Visits 17    Date for PT Re-Evaluation 07/24/21    PT Start Time 1545    PT Stop Time 1630    PT Time Calculation (min) 45 min    Activity Tolerance Patient tolerated treatment well;No increased pain    Behavior During Therapy [facility] for tasks assessed/performed             No past medical history on file.  No past surgical history on file.  There were no vitals filed for this visit.   Subjective Assessment - 06/21/21 1549     Subjective Pt reports that he does not feel pain walking around, but that he does feel it during work outs. He does notice that he does not have as much pain after workouts.    Currently in Pain? No/denies            THEREX:   Single Leg Press #105 2 x 15   Single Leg Knee Extension #35 2 x 15  -Needed rest break after 8   Bulgarian Split Squat #30 2 x 15 -Vcs to maintain toes and knees pointed forward and to keep weight on left side to center himself  Education on using patella stap to decrease pain during activity.   Marching Bridges 1 X 10   Single Leg RDL 1 x 10   Updated HEP and educated patient on changes to exercises and addition of new exercises      PT Short Term Goals - 06/22/21 0952       PT SHORT TERM GOAL #1   Title Pt will be indep with HEP to improve flexibility and pain with running.    Baseline 6/7: initiated    Time 4    Period Weeks    Status New    Target Date 06/26/21      PT SHORT TERM GOAL #2   Title Pt will complete 1 mile of running without experiencing knee pain > 2/10 NPS.    Baseline 6/7: Up  to 8/10 NPS with running    Time 2    Period Weeks    Status New    Target Date 06/12/21               PT Long Term Goals - 06/22/21 0952       PT LONG TERM GOAL #1   Title Pt will improve FOTO to target score to display improvements in functional mobility.    Baseline 6/7: 65/86    Time 8    Period Weeks    Status New      PT LONG TERM GOAL #2   Title Pt will complete 4 miles of running without experiencing an increase in R knee pain.    Baseline 6/7: Up to 8/10 pain NPS.    Time 8    Period Weeks    Status New                Plan - 06/22/21 0951     Clinical Impression Statement Pt presents for follow-up for right pain secondary  to right knee patellar tendinopathy. He demonstrates decreased pain from prior week's activity modification and isotonic protocol. He is consisitently experiencing a 3/10 after strenous activity which is a decrease from 4/10 from last session. Pt educated on addtional hamstring exercises to add to home exercises to balance quad heavy exercise regiment. PT recommended use of patellar strap on right knee to see if this relieves pain. He will continue to benefit from skilled PT to decrease right knee pain with activity and to return to running prolonged distances.    Personal Factors and Comorbidities Age;Education;Profession;Past/Current Experience;Fitness;Time since onset of injury/illness/exacerbation    Examination-Activity Limitations Locomotion Level   Running   Examination-Participation Restrictions School;Occupation    Stability/Clinical Decision Making Stable/Uncomplicated    Rehab Potential Good    PT Frequency 2x / week    PT Duration 8 weeks    PT Treatment/Interventions ADLs/Self Care Home Management;Aquatic Therapy;Cryotherapy;Electrical Stimulation;Iontophoresis 4mg /ml Dexamethasone;Moist Heat;Traction;Ultrasound;Gait training;Stair training;Functional mobility training;Therapeutic activities;Patient/family education;Therapeutic  exercise;Balance training;Neuromuscular re-education;Passive range of motion;Dry needling;Manual techniques;Spinal Manipulations;Joint Manipulations    PT Next Visit Plan Progress to 3 set max on isotonic portion of protocol, assess knee pain with single leg squat on incline.    PT Home Exercise Plan Access Code:    Consulted and Agree with Plan of Care Patient            HEP includes the following:   Access Code: [email or web address] URL: [email or web address] Date: 06/21/2021 Prepared by: 06/23/2021  Exercises Half Kneeling Hip Flexor Stretch - 2 x daily - 7 x weekly - 2 sets - 2 reps - 30 hold Gastroc Stretch on Wall - 2 x daily - 7 x weekly - 2 sets - 2 reps - 30 hold Long Sitting Calf Stretch with Strap - 2 x daily - 7 x weekly - 2 sets - 2 reps - 30 hold Pigeon Stretch with TRX - 1 x daily - 7 x weekly - 2 sets - 2 reps - 30 hold Sidelying TFL Stretch - 1 x daily - 7 x weekly - 1 sets - 5 reps - 30 hold Supine Quad Set - 1 x daily - 7 x weekly - 3 sets - 5 reps - 45 sec hold Single Leg Quarter Squat with Swiss Ball at Ellin Goodie - 1 x daily - 7 x weekly - 3 sets - 5 reps - 45 sec hold Single Leg Knee Extension with Weight Machine - 1 x daily - 2 x weekly - 2 sets - 15 reps Single Leg Press - 1 x daily - 2 x weekly - 2 sets - 15 reps Marching Bridge - 1 x daily - 3 x weekly - 3 sets - 10 reps Forward T with Counter Support - 1 x daily - 3 x weekly - 3 sets - 10 reps   Patient will benefit from skilled therapeutic intervention in order to improve the following deficits and impairments:  Pain, Improper body mechanics, Decreased mobility, Impaired flexibility  Visit Diagnosis: Acute pain of right knee     Problem List There are no problems to display for this patient.  [employer] PT, DPT  06/22/2021, 9:56 AM  [facility] PHYSICAL AND SPORTS MEDICINE [address], [place] Phone: [phone]   Fax:   [phone]  Name: Jonathan Solis MRN: Jonathan Solis Date of Birth: 29-Nov-2001

## 2021-06-26 ENCOUNTER — Other Ambulatory Visit: Payer: Self-pay

## 2021-06-26 ENCOUNTER — Ambulatory Visit: Payer: 59 | Attending: Orthopedic Surgery | Admitting: Physical Therapy

## 2021-06-26 DIAGNOSIS — M25561 Pain in right knee: Secondary | ICD-10-CM | POA: Diagnosis not present

## 2021-06-26 NOTE — Therapy (Signed)
[facility] PHYSICAL AND SPORTS MEDICINE [address], [place], [address] Phone: [phone]   Fax:  [phone]  Physical Therapy Treatment  Patient Details  Name: Jonathan Solis MRN: [id number] Date of Birth: 06/03/2001 Referring Provider (PT): Lasandra Beech   Encounter Date: 06/26/2021    No past medical history on file.  No past surgical history on file.  There were no vitals filed for this visit.   Subjective Assessment - 06/26/21 1334     Subjective Pt reports that he hyperextended knee over weekend playing disc golf and he had some pain but it has subsidized over time.    Pertinent History Pt is a 20 y.o. male referred to PT for acute to subacute R knee pain that has been going on for the last few weeks. No pertinent PMH. Pain located on lateral aspect of his knee described as sharp that is worsened with weightbearing, knee AROM, and running. Unable to run > 1/4 mile due to pain. Popping of R knee reported but no locking or giving way. Has imaging of R knee with no concerns of fractures or other abnormalities. Pt initially denies previous hx of knee pain. Pain began May 8th training for half marathon when running ~8 miles. Pt reports running with R foot externally rotated and is wondering if his running mechanics is effecting it. Rest has been helpful and pain has been improving. When pain was at its worst, stopped running and elevated which helped. Pain located on lat aspect outside of patella. Described as sharp pain during swing phase of gait and terminal stance with push off. Worst pain: 8/10, current pain: 0/10, best pain:  0/10. Pt reports taking close to whole day to reduce from 8/10. Painful movements also with lateral lunges and movements. Pt's main goal with PT is to find exercises above and below knee.    Limitations Standing    How long can you sit comfortably? Unlimited    How long can you stand comfortably? As long as  needed    How long can you walk comfortably? Unlimited    Diagnostic tests X ray    Patient Stated Goals To be able to complete sports exercises and running long distances without experiencing pain.    Pain Score 3     Pain Location Knee    Pain Orientation Right    Pain Descriptors / Indicators Aching    Pain Type Acute pain    Pain Onset More than a month ago    Pain Frequency Intermittent    Aggravating Factors  Exercising    Pain Relieving Factors Rest    Effect of Pain on Daily Activities Limits exercise            THEREX:   Single Leg Press #105 3 x 15   Single Leg Knee Extension #35 3 x 15  -Needed rest breaks throughout exercises    Bulgarian Split Squat #30 3 x 15 -Vcs to maintain toes and knees pointed forward and to keep weight on left side to center himself   No increase in pain after performing exercises.      PT Short Term Goals - 06/22/21 4536       PT SHORT TERM GOAL #1   Title Pt will be indep with HEP to improve flexibility and pain with running.    Baseline 6/7: initiated    Time 4    Period Weeks    Status New  Target Date 06/26/21      PT SHORT TERM GOAL #2   Title Pt will complete 1 mile of running without experiencing knee pain > 2/10 NPS.    Baseline 6/7: Up to 8/10 NPS with running    Time 2    Period Weeks    Status New    Target Date 06/12/21               PT Long Term Goals - 06/22/21 0952       PT LONG TERM GOAL #1   Title Pt will improve FOTO to target score to display improvements in functional mobility.    Baseline 6/7: 65/86    Time 8    Period Weeks    Status New      PT LONG TERM GOAL #2   Title Pt will complete 4 miles of running without experiencing an increase in R knee pain.    Baseline 6/7: Up to 8/10 pain NPS.    Time 8    Period Weeks    Status New                   Plan - 06/26/21 1449     Clinical Impression Statement Pt presents for follow-up for right pain secondary to right  knee patellar tendinopathy. He exhibits an increase in his right knee strength with an increase in the number of sets from 2 to 3 without experiencing an increase in his pain from the beginnning of session. He will continue to following isotonic protocol of completing 3 sets of 15 repitition max and then progress to 3 sets of 6 RM.  Pt will continue to benefit from skilled PT to decrease right knee pain in order to return to long distance running.    Personal Factors and Comorbidities Age;Education;Profession;Past/Current Experience;Fitness;Time since onset of injury/illness/exacerbation    Examination-Activity Limitations Locomotion Level   Running   Examination-Participation Restrictions School;Occupation    Stability/Clinical Decision Making Stable/Uncomplicated    Rehab Potential Good    PT Frequency 2x / week    PT Duration 8 weeks    PT Treatment/Interventions ADLs/Self Care Home Management;Aquatic Therapy;Cryotherapy;Electrical Stimulation;Iontophoresis 4mg /ml Dexamethasone;Moist Heat;Traction;Ultrasound;Gait training;Stair training;Functional mobility training;Therapeutic activities;Patient/family education;Therapeutic exercise;Balance training;Neuromuscular re-education;Passive range of motion;Dry needling;Manual techniques;Spinal Manipulations;Joint Manipulations    PT Next Visit Plan Continue 3 set x 15 RM on isotonic portion of protocol, assess knee pain with single leg squat on incline.    PT Home Exercise Plan Access Code:    Consulted and Agree with Plan of Care Patient             Patient will benefit from skilled therapeutic intervention in order to improve the following deficits and impairments:  Pain, Improper body mechanics, Decreased mobility, Impaired flexibility  Visit Diagnosis: Acute pain of right knee   Problem List There are no problems to display for this patient.  [id number] PT, DPT   06/26/2021, 3:30 PM  [place] [facility] PHYSICAL AND SPORTS MEDICINE [address], [place] Phone: [phone]   Fax:  [phone]  Name: KORTLAND NICHOLS MRN: [name] Date of Birth: September 07, 2001

## 2021-06-28 ENCOUNTER — Ambulatory Visit: Payer: 59 | Admitting: Physical Therapy

## 2021-06-28 DIAGNOSIS — M25561 Pain in right knee: Secondary | ICD-10-CM | POA: Diagnosis not present

## 2021-06-28 NOTE — Therapy (Addendum)
[facility] PH[facility] AND [facility]OR[facility] MEDICINE [address], [place], [address] Phone: [phone]   Fax:  [phone]  Physical Therapy Treatment  Patient Details  Name: Jonathan Solis MRN: [id number] Date of Birth: 2001/09/16 Referring Provider (PT): Lasandra Beech   Encounter Date: 06/28/2021   PT End of Session - 06/28/21 1429     Visit Number 8    Number of Visits 17    Date for PT Re-Evaluation 07/24/21    PT Start Time 1330    PT Stop Time 1415    PT Time Calculation (min) 45 min    Activity Tolerance Patient tolerated treatment well;No increased pain    Behavior During Therapy [facility] for tasks assessed/performed             No past medical history on file.  No past surgical history on file.  There were no vitals filed for this visit.   Subjective Assessment - 06/28/21 1416     Subjective Pt reports no change in his pain even after going to the gym earlier to day.    Pertinent History Pt is a 20 y.o. male referred to PT for acute to subacute R knee pain that has been going on for the last few weeks. No pertinent PMH. Pain located on lateral aspect of his knee described as sharp that is worsened with weightbearing, knee AROM, and running. Unable to run > 1/4 mile due to pain. Popping of R knee reported but no locking or giving way. Has imaging of R knee with no concerns of fractures or other abnormalities. Pt initially denies previous hx of knee pain. Pain began May 8th training for half marathon when running ~8 miles. Pt reports running with R foot externally rotated and is wondering if his running mechanics is effecting it. Rest has been helpful and pain has been improving. When pain was at its worst, stopped running and elevated which helped. Pain located on lat aspect outside of patella. Described as sharp pain during swing phase of gait and terminal stance with push off. Worst pain: 8/10, current pain: 0/10, best pain:  0/10. Pt  reports taking close to whole day to reduce from 8/10. Painful movements also with lateral lunges and movements. Pt's main goal with PT is to find exercises above and below knee.    Limitations Standing    How long can you sit comfortably? Unlimited    How long can you stand comfortably? As long as needed    How long can you walk comfortably? Unlimited    Diagnostic tests X ray    Patient Stated Goals To be able to complete sports exercises and running long distances without experiencing pain.    Currently in Pain? Yes    Pain Score 3     Pain Location Knee    Pain Orientation Right    Pain Descriptors / Indicators Aching    Pain Type Acute pain    Pain Onset More than a month ago            THEREX:   Single Leg Press #105 3 x 15  Single Leg Knee Extension #35 3 x 15  -Vcs to adjust machine to increase ROM.    Avoided doing [place] split squats because pt already completed earlier.   Supine Hamstring stretch on wall 2 x 30 sec  -Pt reports difficulty applying pressure   Standing hamstring stretch 2 x 30 sec  -Vcs to maintain hips pointed  forward     PT Short Term Goals - 06/28/21 1417       PT SHORT TERM GOAL #1   Title Pt will be indep with HEP to improve flexibility and pain with running.    Baseline 6/7: initiated    Time 4    Period Weeks    Status New    Target Date 06/26/21      PT SHORT TERM GOAL #2   Title Pt will complete 1 mile of running without experiencing knee pain > 2/10 NPS.    Baseline 6/7: Up to 8/10 NPS with running    Time 2    Period Weeks    Status New    Target Date 06/12/21               PT Long Term Goals - 06/28/21 1418       PT LONG TERM GOAL #1   Title Pt will improve FOTO to target score to display improvements in functional mobility.    Baseline 6/7: 65/86    Time 8    Period Weeks    Status New      PT LONG TERM GOAL #2   Title Pt will complete 4 miles of running without experiencing an increase in R knee pain.     Baseline 6/7: Up to 8/10 pain NPS. 7/7: Has not attempted running    Time 8    Period Weeks    Status New              Plan - 06/28/21 1440     Clinical Impression Statement Pt presents for follow-up for right pain secondary to right patellar tendinopathy. Pt shows limited progress towards goals. However, this is due to activity modifications with restrictions to activity to decrease inflammation. Pt has demonstrated a decrease in pain and progression through protocol. Next session, pt will progress towards energy storage phase with plyometric exercises. He will continue to benefit from skilled PT to decrease right knee pain in order to return to running and increased activity with right knee.    Personal Factors and Comorbidities Age;Education;Profession;Past/Current Experience;Fitness;Time since onset of injury/illness/exacerbation    Examination-Activity Limitations Locomotion Level   Running   Examination-Participation Restrictions School;Occupation    Stability/Clinical Decision Making Stable/Uncomplicated    Rehab Potential Good    PT Frequency 2x / week    PT Duration 8 weeks    PT Treatment/Interventions ADLs/Self Care Home Management;Aquatic Therapy;Cryotherapy;Electrical Stimulation;Iontophoresis 4mg /ml Dexamethasone;Moist Heat;Traction;Ultrasound;Gait training;Stair training;Functional mobility training;Therapeutic activities;Patient/family education;Therapeutic exercise;Balance training;Neuromuscular re-education;Passive range of motion;Dry needling;Manual techniques;Spinal Manipulations;Joint Manipulations    PT Next Visit Plan Test pt on leg press and progress towards exercises with energy storage: single leg hops, split squats, etc.    PT Home Exercise Plan Access Code:    Consulted and Agree with Plan of Care Patient            HEP includes following:  Access Code: [email or web address] URL: [email or web address] Date: 06/28/2021 Prepared by: 08/29/2021  Exercises Half Kneeling Hip Flexor Stretch - 2 x daily - 7 x weekly - 2 sets - 2 reps - 30 hold Gastroc Stretch on Wall - 2 x daily - 7 x weekly - 2 sets - 2 reps - 30 hold Long Sitting Calf Stretch with Strap - 2 x daily - 7 x weekly - 2 sets - 2 reps - 30 hold Pigeon Stretch with TRX - 1 x daily - 7 x weekly - 2 sets -  2 reps - 30 hold Sidelying TFL Stretch - 1 x daily - 7 x weekly - 1 sets - 5 reps - 30 hold Supine Quad Set - 1 x daily - 7 x weekly - 3 sets - 5 reps - 45 sec hold Single Leg Quarter Squat with Swiss Ball at [employer] - 1 x daily - 7 x weekly - 3 sets - 5 reps - 45 sec hold Single Leg Knee Extension with Weight Machine - 1 x daily - 2 x weekly - 2 sets - 15 reps Single Leg Press - 1 x daily - 2 x weekly - 2 sets - 15 reps Marching Bridge - 1 x daily - 3 x weekly - 3 sets - 10 reps Forward T with Counter Support - 1 x daily - 3 x weekly - 3 sets - 10 reps Standing Hamstring Stretch on Chair - 1 x daily - 7 x weekly - 1 sets - 5 reps - 30 hold    Patient will benefit from skilled therapeutic intervention in order to improve the following deficits and impairments:  Pain, Improper body mechanics, Decreased mobility, Impaired flexibility  Visit Diagnosis: Acute pain of right knee    Problem List There are no problems to display for this patient.  Ellin Goodie PT, DPT  06/28/2021, 3:00 PM  [place] [facility] PHYSICAL AND SPORTS MEDICINE [address], [place], [address] Phone: [phone]   Fax:  [phone]  Name: AZURE BARRALES MRN: [id number] Date of Birth: [birth year]

## 2021-07-03 ENCOUNTER — Ambulatory Visit: Payer: 59 | Admitting: Physical Therapy

## 2021-07-03 DIAGNOSIS — M25561 Pain in right knee: Secondary | ICD-10-CM

## 2021-07-03 NOTE — Therapy (Signed)
[place] [facility] PHYSICAL AND SPORTS MEDICINE [address] [place], [address] Phone: [phone]   Fax:  [phone]  Physical Therapy Treatment  Patient Details  Name: Jonathan Solis MRN: [id number] Date of Birth: 04/05/2001 Referring Provider (PT): Lasandra Beech   Encounter Date: 07/03/2021    Subjective Assessment - 07/03/21 1348     Subjective Pt reports tha the has not been experiencing an increase in his right knee even after doing several LE exercises this weekend.    Pertinent History Pt is a 20 y.o. male referred to PT for acute to subacute R knee pain that has been going on for the last few weeks. No pertinent PMH. Pain located on lateral aspect of his knee described as sharp that is worsened with weightbearing, knee AROM, and running. Unable to run > 1/4 mile due to pain. Popping of R knee reported but no locking or giving way. Has imaging of R knee with no concerns of fractures or other abnormalities. Pt initially denies previous hx of knee pain. Pain began May 8th training for half marathon when running ~8 miles. Pt reports running with R foot externally rotated and is wondering if his running mechanics is effecting it. Rest has been helpful and pain has been improving. When pain was at its worst, stopped running and elevated which helped. Pain located on lat aspect outside of patella. Described as sharp pain during swing phase of gait and terminal stance with push off. Worst pain: 8/10, current pain: 0/10, best pain:  0/10. Pt reports taking close to whole day to reduce from 8/10. Painful movements also with lateral lunges and movements. Pt's main goal with PT is to find exercises above and below knee.    Limitations Standing    How long can you sit comfortably? Unlimited    How long can you stand comfortably? As long as needed    How long can you walk comfortably? Unlimited    Diagnostic tests X ray    Patient Stated Goals To be able  to complete sports exercises and running long distances without experiencing pain.    Currently in Pain? No/denies    Pain Onset More than a month ago             THEREX:   Single Leg Press #145 4 x 6    Single Leg Knee Extension #55 4 x 6   -Vcs to adjust machine to increase ROM.   [place] Split Squat #100: 40 lb kettlbell, 30 lb kettle bell, 30 lb backpack 6 x 4   Demonstrated how to complete isometric knee extension exercise on knee extension machine and how to perform a spanish squat for single leg squat isometric exercise.   Educated on completing 6 RM isotonic exercises and isometric exercises on off days.    PT Education - 07/03/21 1338     Education Details form/technique for exercise    Person(s) Educated Patient    Methods Explanation    Comprehension Verbalized understanding;Returned demonstration              PT Short Term Goals - 07/03/21 1352       PT SHORT TERM GOAL #1   Title Pt will be indep with HEP to improve flexibility and pain with running.    Baseline 6/7: initiated    Time 4    Period Weeks    Status New    Target Date 06/26/21      PT SHORT  TERM GOAL #2   Title Pt will complete 1 mile of running without experiencing knee pain > 2/10 NPS.    Baseline 6/7: Up to 8/10 NPS with running    Time 2    Period Weeks    Status New    Target Date 06/12/21               PT Long Term Goals - 07/03/21 1352       PT LONG TERM GOAL #1   Title Pt will improve FOTO to target score to display improvements in functional mobility.    Baseline 6/7: 65/86    Time 8    Period Weeks    Status New      PT LONG TERM GOAL #2   Title Pt will complete 4 miles of running without experiencing an increase in R knee pain.    Baseline 6/7: Up to 8/10 pain NPS. 7/7: Has not attempted running    Time 8    Period Weeks    Status New               Plan - 07/03/21 1353     Clinical Impression Statement Pt presents for f/u for patellar  tendinopathy. He was able to progress to 6 RM portion of isotonic phase of patellar tendinopathy rehab protocol without experiencing an increase in his right knee pain. He will continue to benefit from skilled PT to reduce right knee with activity in order to return to long distance running.    Personal Factors and Comorbidities Age;Education;Profession;Past/Current Experience;Fitness;Time since onset of injury/illness/exacerbation    Examination-Activity Limitations Locomotion Level   Running   Examination-Participation Restrictions School;Occupation    Stability/Clinical Decision Making Stable/Uncomplicated    Rehab Potential Good    PT Frequency 2x / week    PT Duration 8 weeks    PT Treatment/Interventions ADLs/Self Care Home Management;Aquatic Therapy;Cryotherapy;Electrical Stimulation;Iontophoresis 4mg /ml Dexamethasone;Moist Heat;Traction;Ultrasound;Gait training;Stair training;Functional mobility training;Therapeutic activities;Patient/family education;Therapeutic exercise;Balance training;Neuromuscular re-education;Passive range of motion;Dry needling;Manual techniques;Spinal Manipulations;Joint Manipulations    PT Next Visit Plan SLS on incline board determine if there is pain. Test pt on leg press and progress towards exercises with energy storage: single leg hops, split squats, etc.    PT Home Exercise Plan Access Code:    Consulted and Agree with Plan of Care Patient             Patient will benefit from skilled therapeutic intervention in order to improve the following deficits and impairments:  Pain, Improper body mechanics, Decreased mobility, Impaired flexibility  Visit Diagnosis: Acute pain of right knee     Problem List There are no problems to display for this patient.  [id number] PT, DPT  07/03/2021, 2:12 PM  [place] [facility] PHYSICAL AND SPORTS MEDICINE [address], [place] Phone: [phone]   Fax:   [phone]  Name: Jonathan Solis MRN: [name] Date of Birth: 12/18/2001

## 2021-07-05 ENCOUNTER — Ambulatory Visit: Payer: 59 | Admitting: Physical Therapy

## 2021-07-05 DIAGNOSIS — M25561 Pain in right knee: Secondary | ICD-10-CM | POA: Diagnosis not present

## 2021-07-05 NOTE — Therapy (Signed)
[place] [facility] PH[facility]ORTS MEDICINE [address] [place], [address] Phone: [phone]   Fax:  [phone]  Physical Therapy Treatment  Patient Details  Name: Jonathan Solis MRN: [id number] Date of Birth: [birth year] Referring Provider (PT): Lasandra Beech   Encounter Date: 07/05/2021   PT End of Session - 07/05/21 1333     Visit Number 10    Number of Visits 17    Date for PT Re-Evaluation 07/24/21    PT Start Time 1330    PT Stop Time 1415    PT Time Calculation (min) 45 min    Activity Tolerance Patient tolerated treatment well;No increased pain    Behavior During Therapy [facility] for tasks assessed/performed             No past medical history on file.  No past surgical history on file.  There were no vitals filed for this visit.   Subjective Assessment - 07/05/21 1334     Subjective Pt reports that he has not experienced increased pain in knee even after increased activity .    Pertinent History Pt is a 20 y.o. male referred to PT for acute to subacute R knee pain that has been going on for the last few weeks. No pertinent PMH. Pain located on lateral aspect of his knee described as sharp that is worsened with weightbearing, knee AROM, and running. Unable to run > 1/4 mile due to pain. Popping of R knee reported but no locking or giving way. Has imaging of R knee with no concerns of fractures or other abnormalities. Pt initially denies previous hx of knee pain. Pain began May 8th training for half marathon when running ~8 miles. Pt reports running with R foot externally rotated and is wondering if his running mechanics is effecting it. Rest has been helpful and pain has been improving. When pain was at its worst, stopped running and elevated which helped. Pain located on lat aspect outside of patella. Described as sharp pain during swing phase of gait and terminal stance with push off. Worst pain: 8/10, current pain: 0/10,  best pain:  0/10. Pt reports taking close to whole day to reduce from 8/10. Painful movements also with lateral lunges and movements. Pt's main goal with PT is to find exercises above and below knee.    Limitations Standing    How long can you sit comfortably? Unlimited    How long can you stand comfortably? As long as needed    How long can you walk comfortably? Unlimited    Diagnostic tests X ray    Patient Stated Goals To be able to complete sports exercises and running long distances without experiencing pain.    Pain Onset More than a month ago             Single Leg Press #165 4 x 6    Single Leg Knee Extension #65 4 x 6     [place] Split Squat #100: 40 lb kettlbell, 30 lb kettle bell, 30 lb backpack 6 x 4     Educated on completing 6 RM isotonic exercises and isometric exercises on off days.      PT Short Term Goals - 07/03/21 1352       PT SHORT TERM GOAL #1   Title Pt will be indep with HEP to improve flexibility and pain with running.    Baseline 6/7: initiated    Time 4    Period Weeks  Status New    Target Date 06/26/21      PT SHORT TERM GOAL #2   Title Pt will complete 1 mile of running without experiencing knee pain > 2/10 NPS.    Baseline 6/7: Up to 8/10 NPS with running    Time 2    Period Weeks    Status New    Target Date 06/12/21               PT Long Term Goals - 07/05/21 1343       PT LONG TERM GOAL #1   Title Pt will improve FOTO to target score to display improvements in functional mobility.    Baseline 6/7: 65/86 7/6: 65/86    Time 8    Period Weeks    Status New      PT LONG TERM GOAL #2   Title Pt will complete 4 miles of running without experiencing an increase in R knee pain.    Baseline 6/7: Up to 8/10 pain NPS. 7/7: Has not attempted running    Time 8    Period Weeks    Status New                   Plan - 07/05/21 1335     Clinical Impression Statement Pt presents for f/u for patellar tendinopathy. He  continues to be able to complete 6 RM portion of isotonic phase of patellar tendinopathy rehab protocol without experiencing an increase in his right knee pain. He also demonstrates an improvement in his quadriceps strength with ability to perform a higher weight for leg press and knee extension with his 6 RM. Next session he will progress to energy storage phase of protocol after assessing him for his single leg press with 150% of BW for 4 sets of 8 repititions.    Personal Factors and Comorbidities Age;Education;Profession;Past/Current Experience;Fitness;Time since onset of injury/illness/exacerbation    Examination-Activity Limitations Locomotion Level   Running   Examination-Participation Restrictions School;Occupation    Stability/Clinical Decision Making Stable/Uncomplicated    Rehab Potential Good    PT Frequency 2x / week    PT Duration 8 weeks    PT Treatment/Interventions ADLs/Self Care Home Management;Aquatic Therapy;Cryotherapy;Electrical Stimulation;Iontophoresis 4mg /ml Dexamethasone;Moist Heat;Traction;Ultrasound;Gait training;Stair training;Functional mobility training;Therapeutic activities;Patient/family education;Therapeutic exercise;Balance training;Neuromuscular re-education;Passive range of motion;Dry needling;Manual techniques;Spinal Manipulations;Joint Manipulations    PT Next Visit Plan Assess on single leg press 4 sets of 8 reps of single leg press of 150% of BW    PT Home Exercise Plan Access Code:    Consulted and Agree with Plan of Care Patient             Patient will benefit from skilled therapeutic intervention in order to improve the following deficits and impairments:  Pain, Improper body mechanics, Decreased mobility, Impaired flexibility  Visit Diagnosis: Acute pain of right knee     Problem List There are no problems to display for this patient.  [id number] PT, DPT  07/05/2021, 2:13 PM  [place] [facility]  PHYSICAL AND SPORTS MEDICINE [address], [place] Phone: [phone]   Fax:  [phone]  Name: LUISALBERTO BEEGLE MRN: [name] Date of Birth: 29-Jul-2001

## 2021-07-09 ENCOUNTER — Ambulatory Visit: Payer: 59 | Admitting: Physical Therapy

## 2021-07-09 DIAGNOSIS — M25561 Pain in right knee: Secondary | ICD-10-CM | POA: Diagnosis not present

## 2021-07-09 NOTE — Therapy (Addendum)
[facility] PHYSICAL AND SPORTS MEDICINE [address] [place], [address] Phone: [phone]   Fax:  [phone]  Physical Therapy Treatment  Patient Details  Name: Jonathan Solis MRN: [id number] Date of Birth: [birth year] Referring Provider (PT): Jonathan Solis   Encounter Date: 07/09/2021   PT End of Session - 07/10/21 1037     Visit Number 11    Number of Visits 17    Date for PT Re-Evaluation 07/24/21    PT Start Time 1330    PT Stop Time 1415    PT Time Calculation (min) 45 min    Activity Tolerance Patient tolerated treatment well;No increased pain    Behavior During Therapy [facility] for tasks assessed/performed             No past medical history on file.  No past surgical history on file.  There were no vitals filed for this visit.   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    Currently in Pain? No/denies                PT Short Term Goals - 07/10/21 1029       PT SHORT TERM GOAL #1   Title Pt will be indep with HEP to improve flexibility and pain with running.    Baseline 6/7: initiated    Time 4    Period Weeks    Status Achieved    Target Date 06/26/21      PT SHORT TERM GOAL #2   Title Pt will complete 1 mile of running without experiencing knee pain > 2/10 NPS.    Baseline 6/7: Up to 8/10 NPS with running    Time 2    Period Weeks    Status New    Target Date 06/12/21               PT Long Term Goals - 07/10/21 1033       PT LONG TERM GOAL #1   Title Pt will improve FOTO to target score to display improvements in functional mobility.    Baseline 6/7: 65/86 7/6: 65/86    Time 8    Period Weeks    Status New      PT LONG TERM GOAL #2   Title Pt will complete 4 miles of running without experiencing an increase in R knee pain.    Baseline 6/7: Up to 8/10 pain NPS. 7/7: Has not  attempted running    Time 8    Period Weeks    Status New              THEREX:   10 X Squat Jump  [place]bee Vertical Jump for Pass x 30  Slalom Right to Left Bound 60 sec  Coin Jumps for vertical bound 30 sec  Forward and Backward over 6 inch Obstacle 30 sec  Split Squat Jumps 30 sec  1.5 ft Platform Single Leg Jumps 30 sec  1.5 ft Platform Single Leg Jumps 30 sec   -Pt reports right knee pain of 1 to 2 on pain scale      Plan - 07/10/21 1033     Clinical Impression Statement Pt presents for f/u for right knee pain 2/2 patellar tendinopathy. He was able to successfully initiate energy storage phase of knee rehab protocol with completion of plyometric exercises without any significant increase  in his right knee pain. Pt also demonstrates improvement with symptoms with ability to tolerate increased LE activity without an increase in his right knee pain. He will continue to benefit from skilled PT to decrease right knee pain with activity in order to return to recreational long distance running.    Personal Factors and Comorbidities Age;Education;Profession;Past/Current Experience;Fitness;Time since onset of injury/illness/exacerbation    Examination-Activity Limitations Locomotion Level   Running   Examination-Participation Restrictions School;Occupation    Stability/Clinical Decision Making Stable/Uncomplicated    Rehab Potential Good    PT Frequency 2x / week    PT Duration 8 weeks    PT Treatment/Interventions ADLs/Self Care Home Management;Aquatic Therapy;Cryotherapy;Electrical Stimulation;Iontophoresis 4mg /ml Dexamethasone;Moist Heat;Traction;Ultrasound;Gait training;Stair training;Functional mobility training;Therapeutic activities;Patient/family education;Therapeutic exercise;Balance training;Neuromuscular re-education;Passive range of motion;Dry needling;Manual techniques;Spinal Manipulations;Joint Manipulations    PT Next Visit Plan Complete re-eval of goals for progress  note. Continue to progress plyometric exercises: Single Leg Hops using ladders    PT Home Exercise Plan Access Code:    Consulted and Agree with Plan of Care Patient            HEP includes: Patient instructed to begin participating in plyometric program that he is doing through HIIT program at gym.   Patient will benefit from skilled therapeutic intervention in order to improve the following deficits and impairments:  Pain, Improper body mechanics, Decreased mobility, Impaired flexibility  Visit Diagnosis: Acute pain of right knee     Problem List There are no problems to display for this patient.  [email or web address] PT, DPT  07/10/2021, 10:42 AM  [place] PHYSICAL AND SPORTS MEDICINE [address], [place] Phone: [phone]   Fax:  [phone]  Name: Jonathan Solis MRN: Jonathan Solis Date of Birth: Mar 15, 2001

## 2021-07-11 ENCOUNTER — Ambulatory Visit: Payer: 59 | Admitting: Physical Therapy

## 2021-07-11 DIAGNOSIS — M25561 Pain in right knee: Secondary | ICD-10-CM

## 2021-07-11 NOTE — Therapy (Signed)
[place] PHYSICAL AND SPORTS MEDICINE [address] [place], [address] Phone: [phone]   Fax:  [phone]  Physical Therapy Treatment  Patient Details  Name: Jonathan Solis MRN: [id number] Date of Birth: 2001-10-09 Referring Provider (PT): Tamala Julian   Encounter Date: 07/11/2021   PT End of Session - 07/11/21 1554     Visit Number 12    Number of Visits 17    Date for PT Re-Evaluation 07/24/21    PT Start Time 7341    PT Stop Time 1630    PT Time Calculation (min) 45 min    Activity Tolerance Patient tolerated treatment well;No increased pain    Behavior During Therapy [facility] for tasks assessed/performed             No past medical history on file.  No past surgical history on file.  There were no vitals filed for this visit.   Subjective Assessment - 07/11/21 1553     Subjective Pt reports no increase in knee pain after doing early morning exercise including plyometrics. He did do an extensive amount of exercises before coming in today's session.    Currently in Pain? Yes    Pain Score 2     Pain Location Knee    Pain Orientation Right    Pain Descriptors / Indicators Aching    Pain Type Acute pain            PHYSICAL PERFORMANCE   1 mi Run -Pain increase by  0.5 from 2 to 2.5 noted when running slower   Education about activity modification with not completing excessive plyometric exercise to avoid aggravating knee. Discussion about progression to return to sport phase with cutting and jumping drills.        PT Short Term Goals - 07/11/21 1603       PT SHORT TERM GOAL #1   Title Pt will be indep with HEP to improve flexibility and pain with running.    Baseline 6/7: initiated    Time 4    Period Weeks    Status Achieved    Target Date 06/26/21      PT SHORT TERM GOAL #2   Title Pt will complete 1 mile of running without experiencing knee pain > 2/10 NPS.    Baseline 6/7: Up to 8/10 NPS  with running 7/20: 2.5/10    Time 2    Period Weeks    Status Partially Met    Target Date 06/12/21               PT Long Term Goals - 07/11/21 1602       PT LONG TERM GOAL #1   Title Pt will improve FOTO to target score to display improvements in functional mobility.    Baseline 6/7: 65/86 7/6: 65/86 7/20: 86/86    Time 8    Period Weeks    Status New      PT LONG TERM GOAL #2   Title Pt will complete 4 miles of running without experiencing an increase in R knee pain.    Baseline 6/7: Up to 8/10 pain NPS. 7/7: Has not attempted running 7/20: 1 mi 2.5/10    Time 8    Period Weeks    Status New                   Plan - 07/11/21 1618     Clinical Impression Statement Pt presents for f/u  for R knee 2/2 patellar tendinopathy. He exhibits a decrease in R knee pain with activity with ability to run for 1 mi and ability to tolerate increased plyometric exercises. He will continue to benefit from skilled PT to progress plyometric exercises and advance to return to sport phase of protocol to return to running long distances and playing ultimate frisbee.    Personal Factors and Comorbidities Age;Education;Profession;Past/Current Experience;Fitness;Time since onset of injury/illness/exacerbation    Examination-Activity Limitations Locomotion Level   Running   Examination-Participation Restrictions School;Occupation    Stability/Clinical Decision Making Stable/Uncomplicated    Rehab Potential Good    PT Frequency 2x / week    PT Duration 8 weeks    PT Treatment/Interventions ADLs/Self Care Home Management;Aquatic Therapy;Cryotherapy;Electrical Stimulation;Iontophoresis 76m/ml Dexamethasone;Moist Heat;Traction;Ultrasound;Gait training;Stair training;Functional mobility training;Therapeutic activities;Patient/family education;Therapeutic exercise;Balance training;Neuromuscular re-education;Passive range of motion;Dry needling;Manual techniques;Spinal Manipulations;Joint  Manipulations    PT Next Visit Plan Continue to progress plyometric exercises: Single Leg Hops using ladders, burpees with broad jump and single leg hops variations.    PT Home Exercise Plan Access Code: [email or web address]   Recommended Other Services Encouraged to continue plyometric exercises independently    Consulted and Agree with Plan of Care Patient             Patient will benefit from skilled therapeutic intervention in order to improve the following deficits and impairments:  Pain, Improper body mechanics, Decreased mobility, Impaired flexibility  Visit Diagnosis: Acute pain of right knee     Problem List There are no problems to display for this patient.  DBradly ChrisPT, DPT  07/11/2021, 4:25 PM  [place] [facility] [address] [place] [address]Phone: [phone]  Fax:  [phone] Name: Jonathan MINEERMRN: [id number] of Birth: [birth year]

## 2021-07-16 ENCOUNTER — Ambulatory Visit: Payer: 59 | Admitting: Physical Therapy

## 2021-07-16 DIAGNOSIS — M25561 Pain in right knee: Secondary | ICD-10-CM | POA: Diagnosis not present

## 2021-07-16 NOTE — Therapy (Signed)
[place] PHYSICAL AND SPORTS MEDICINE [address], [place], [address] Phone: [phone]   Fax:  [phone]  Physical Therapy Treatment  Patient Details  Name: Jonathan Solis MRN: [id number] Date of Birth: 09/23/2001 Referring Provider (PT): Tamala Julian   Encounter Date: 07/16/2021   PT End of Session - 07/16/21 1621     Visit Number 13    Number of Visits 17    Date for PT Re-Evaluation 07/24/21    PT Start Time 7096    PT Stop Time 2836    PT Time Calculation (min) 30 min    Activity Tolerance Patient tolerated treatment well;No increased pain    Behavior During Therapy [facility] for tasks assessed/performed             No past medical history on file.  No past surgical history on file.  There were no vitals filed for this visit.   Subjective Assessment - 07/16/21 1807     Subjective Pt reports feeling more right knee pain after running last session. It went away over weekend and he was able to return to plyometrics    Pertinent History Pt is a 20 y.o. male referred to PT for acute to subacute R knee pain that has been going on for the last few weeks. No pertinent PMH. Pain located on lateral aspect of his knee described as sharp that is worsened with weightbearing, knee AROM, and running. Unable to run > 1/4 mile due to pain. Popping of R knee reported but no locking or giving way. Has imaging of R knee with no concerns of fractures or other abnormalities. Pt initially denies previous hx of knee pain. Pain began May 8th training for half marathon when running ~8 miles. Pt reports running with R foot externally rotated and is wondering if his running mechanics is effecting it. Rest has been helpful and pain has been improving. When pain was at its worst, stopped running and elevated which helped. Pain located on lat aspect outside of patella. Described as sharp pain during swing phase of gait and terminal stance with push off.  Worst pain: 8/10, current pain: 0/10, best pain:  0/10. Pt reports taking close to whole day to reduce from 8/10. Painful movements also with lateral lunges and movements. Pt's main goal with PT is to find exercises above and below knee.    Limitations Standing    How long can you sit comfortably? Unlimited    How long can you stand comfortably? As long as needed    How long can you walk comfortably? Unlimited    Diagnostic tests X ray    Patient Stated Goals To be able to complete sports exercises and running long distances without experiencing pain.    Currently in Pain? Yes    Pain Score 1     Pain Location Knee    Pain Orientation Right    Pain Descriptors / Indicators Aching    Pain Type Chronic pain    Pain Onset More than a month ago             THEREX:   Squat Jumps 30 sec  Skaters 30 sec  Jump Lunges 30 sec  Forward Single Leg Hop Ladders 30 sec  One foot in One Foot Out Lateral Shuffle sec  Two in Two Out Forward  30 sec  Double Lateral Jumps 30 sec  Single Leg Lateral Zig Zags 30 sec  180 Double Jumps 30 sec  PT Education - 07/16/21 1621     Education Details form/technique for exercise    Person(s) Educated Patient    Methods Explanation    Comprehension Verbalized understanding;Returned demonstration;Verbal cues required              PT Short Term Goals - 07/16/21 1554       PT SHORT TERM GOAL #1   Title Pt will be indep with HEP to improve flexibility and pain with running.    Baseline 6/7: initiated    Time 4    Period Weeks    Status Achieved    Target Date 06/26/21      PT SHORT TERM GOAL #2   Title Pt will complete 1 mile of running without experiencing knee pain > 2/10 NPS.    Baseline 6/7: Up to 8/10 NPS with running 7/20: 2.5/10    Time 2    Period Weeks    Status Partially Met    Target Date 06/12/21               PT Long Term Goals - 07/16/21 1623       PT LONG TERM GOAL #1    Title Pt will improve FOTO to target score to display improvements in functional mobility.    Baseline 6/7: 65/86 7/6: 65/86 7/20: 86/86    Time 8    Period Weeks    Status New      PT LONG TERM GOAL #2   Title Pt will complete 4 miles of running without experiencing an increase in R knee pain.    Baseline 6/7: Up to 8/10 pain NPS. 7/7: Has not attempted running 7/20: 1 mi 2.5/10    Time 8    Period Weeks    Status New                   Plan - 07/16/21 1622     Clinical Impression Statement Pt presents for f/u for R knee pain 2/2 patellar tendinopathy. He continues to demonstrate progress towards recovery with ability to perform 30 minutes of plyometrics including single leg hops in forward and lateral direction with significantly increashing his R knee pain. He will conitnue to benefit from skilled PT to progress to return to sport protocol, so that pt can return to recreation running without an increase in his pain.    Personal Factors and Comorbidities Age;Education;Profession;Past/Current Experience;Fitness;Time since onset of injury/illness/exacerbation    Examination-Activity Limitations Locomotion Level   Running   Examination-Participation Restrictions School;Occupation    Stability/Clinical Decision Making Stable/Uncomplicated    Rehab Potential Good    PT Frequency 2x / week    PT Duration 8 weeks    PT Treatment/Interventions ADLs/Self Care Home Management;Aquatic Therapy;Cryotherapy;Electrical Stimulation;Iontophoresis 22m/ml Dexamethasone;Moist Heat;Traction;Ultrasound;Gait training;Stair training;Functional mobility training;Therapeutic activities;Patient/family education;Therapeutic exercise;Balance training;Neuromuscular re-education;Passive range of motion;Dry needling;Manual techniques;Spinal Manipulations;Joint Manipulations    PT Next Visit Plan Increase amount of single leg exercises and lateral movements. Burpees    PT Home Exercise Plan Access Code:  [email or web address](Instructed pt to perform 30 min of single leg plyometrics for 3 days per week)    Consulted and Agree with Plan of Care Patient             Patient will benefit from skilled therapeutic intervention in order to improve the following deficits and impairments:  Pain, Improper body mechanics, Decreased mobility, Impaired flexibility  Visit Diagnosis: Acute pain of right knee     Problem List There are no  problems to display for this patient.  Bradly Chris PT, DPT  07/16/2021, 6:12 PM   [facility] PHYSICAL AND SPORTS MEDICINE [address], [place], [address] Phone: [phone]   Fax:  [phone]  Name: Jonathan Solis MRN: [id number] Date of Birth: 06/03/2001

## 2021-07-18 ENCOUNTER — Ambulatory Visit: Payer: 59 | Admitting: Physical Therapy

## 2021-07-18 DIAGNOSIS — M25561 Pain in right knee: Secondary | ICD-10-CM | POA: Diagnosis not present

## 2021-07-18 NOTE — Therapy (Signed)
[place] PHYSICAL AND SPORTS MEDICINE [address] [place], [address] Phone: [phone]   Fax:  [phone]  Physical Therapy Treatment  Patient Details  Name: Jonathan Solis MRN: [id number] Date of Birth: 2001-03-01 Referring Provider (PT): Tamala Julian   Encounter Date: 07/18/2021   PT End of Session - 07/18/21 1555     Visit Number 14    Number of Visits 17    Date for PT Re-Evaluation 07/24/21    PT Start Time 0345    PT Stop Time 0430    PT Time Calculation (min) 45 min    Activity Tolerance Patient tolerated treatment well;No increased pain    Behavior During Therapy [facility] for tasks assessed/performed             No past medical history on file.  No past surgical history on file.  There were no vitals filed for this visit.   Subjective Assessment - 07/18/21 1550     Subjective Pt reports doing plyometrics earlier in morning and he did not experience any knee pain and he has not experienced achilles tendinopathy pai.    Pertinent History Pt is a 20 y.o. male referred to PT for acute to subacute R knee pain that has been going on for the last few weeks. No pertinent PMH. Pain located on lateral aspect of his knee described as sharp that is worsened with weightbearing, knee AROM, and running. Unable to run > 1/4 mile due to pain. Popping of R knee reported but no locking or giving way. Has imaging of R knee with no concerns of fractures or other abnormalities. Pt initially denies previous hx of knee pain. Pain began May 8th training for half marathon when running ~8 miles. Pt reports running with R foot externally rotated and is wondering if his running mechanics is effecting it. Rest has been helpful and pain has been improving. When pain was at its worst, stopped running and elevated which helped. Pain located on lat aspect outside of patella. Described as sharp pain during swing phase of gait and terminal stance with  push off. Worst pain: 8/10, current pain: 0/10, best pain:  0/10. Pt reports taking close to whole day to reduce from 8/10. Painful movements also with lateral lunges and movements. Pt's main goal with PT is to find exercises above and below knee.    Limitations Standing    How long can you sit comfortably? Unlimited    How long can you stand comfortably? As long as needed    How long can you walk comfortably? Unlimited    Diagnostic tests X ray    Patient Stated Goals To be able to complete sports exercises and running long distances without experiencing pain.    Pain Onset More than a month ago             THEREX:  Squat Jumps 30 sec Double Leg Hop Lateral Jumps over 1 ft obstacle 30 sec  Single Leg Hop Lateral Jumps over 1 ft obstacle 30 sec  Single Leg Hop Star Pattern 30 sec  Single Leg Hop Star Pattern with ball toss 30 sec   Single Leg Forward Hop Ladders with 3 1 ft obstacles x 2  Single Leg Forward Zig Zag through Ladders 3 x 1 ft obstacles x 2  Single Leg Forward Hops through Ladders and jump over 2 x 1 ft and 1 x 2 ft obstacles  Single Leg Bosu Ball Balance with Ladders  3 x 1 ft obstacles x 2  Single Leg Backward Hop with Ladders 3 x 1 ft obstacles x 2  Single Leg Lateral Hop with Ladders 3 x 1 ft obstacles x 2  180 jumps on laterals with 3 x 1 ft obstacles x 2  Lateral Jump onto platform and off of platform on right and left 30 sec      PT Education - 07/18/21 1555     Education Details form/technique for exercise    Person(s) Educated Patient    Methods Explanation;Demonstration;Verbal cues    Comprehension Verbalized understanding;Verbal cues required;Returned demonstration              PT Short Term Goals - 07/18/21 1554       PT SHORT TERM GOAL #1   Title Pt will be indep with HEP to improve flexibility and pain with running.    Baseline 6/7: initiated    Time 4    Period Weeks    Status Achieved    Target Date 06/26/21      PT SHORT TERM GOAL  #2   Title Pt will complete 1 mile of running without experiencing knee pain > 2/10 NPS.    Baseline 6/7: Up to 8/10 NPS with running 7/20: 2.5/10    Time 2    Period Weeks    Status Partially Met    Target Date 06/12/21               PT Long Term Goals - 07/18/21 1554       PT LONG TERM GOAL #1   Title Pt will improve FOTO to target score to display improvements in functional mobility.    Baseline 6/7: 65/86 7/6: 65/86 7/20: 86/86    Time 8    Period Weeks    Status New      PT LONG TERM GOAL #2   Title Pt will complete 4 miles of running without experiencing an increase in R knee pain.    Baseline 6/7: Up to 8/10 pain NPS. 7/7: Has not attempted running 7/20: 1 mi 2.5/10    Time 8    Period Weeks    Status New                   Plan - 07/19/21 1506     Clinical Impression Statement Pt presents for f/u for R knee pain 2/2 patellar tendinopathy. He continues to tolerate increased plyometric activity and shows improvement with ability to perform single leg hop repeatedly without an increase in his R knee pain. He has not reached the point at which he can be progressed to return to sport phase next session which will include long distance running as well as cutting drills. Pt will continue to benefit from skilled PT to decrease right knee pain to resume long distance running.    Personal Factors and Comorbidities Age;Education;Profession;Past/Current Experience;Fitness;Time since onset of injury/illness/exacerbation    Examination-Activity Limitations Locomotion Level   Running   Examination-Participation Restrictions School;Occupation    Stability/Clinical Decision Making Stable/Uncomplicated    Rehab Potential Good    PT Frequency 2x / week    PT Duration 8 weeks    PT Treatment/Interventions ADLs/Self Care Home Management;Aquatic Therapy;Cryotherapy;Electrical Stimulation;Iontophoresis 37m/ml Dexamethasone;Moist Heat;Traction;Ultrasound;Gait training;Stair  training;Functional mobility training;Therapeutic activities;Patient/family education;Therapeutic exercise;Balance training;Neuromuscular re-education;Passive range of motion;Dry needling;Manual techniques;Spinal Manipulations;Joint Manipulations    PT Next Visit Plan Long distance running and cutting drills.    PT Home Exercise Plan Continue plyometric activity. Access  Code: [email or web address] (Instructed pt to perform 30 min of single leg plyometrics for 3 days per week)    Consulted and Agree with Plan of Care Patient             Patient will benefit from skilled therapeutic intervention in order to improve the following deficits and impairments:  Pain, Improper body mechanics, Decreased mobility, Impaired flexibility  Visit Diagnosis: Acute pain of right knee     Problem List There are no problems to display for this patient.  Bradly Chris PT, DPT  07/19/2021, 3:16 PM  [facility] PHYSICAL AND SPORTS MEDICINE [address] [place], [address] Phone: [phone]   Fax:  [phone]  Name: DHILAN BRAUER MRN: [id number] Date of Birth: 2001-10-01

## 2021-07-23 ENCOUNTER — Ambulatory Visit: Payer: 59 | Admitting: Physical Therapy

## 2021-07-30 ENCOUNTER — Ambulatory Visit: Payer: 59 | Attending: Orthopedic Surgery | Admitting: Physical Therapy

## 2021-07-30 DIAGNOSIS — M25561 Pain in right knee: Secondary | ICD-10-CM | POA: Diagnosis not present

## 2021-07-30 NOTE — Therapy (Signed)
[facility] [address] [place], [address] Phone: [phone]   Fax:  [phone]  Physical Therapy Treatment/Re-certification   Patient Details  Name: Jonathan Solis MRN: [id number] Date of Birth: 2001/07/16 Referring Provider (PT): Tamala Julian   Encounter Date: 07/30/2021   PT End of Session - 07/30/21 1417     Visit Number 15    Number of Visits 17    Date for PT Re-Evaluation 07/24/21    PT Start Time 7628    PT Stop Time 1500    PT Time Calculation (min) 45 min    Activity Tolerance Patient tolerated treatment well;No increased pain    Behavior During Therapy [facility] for tasks assessed/performed             No past medical history on file.  No past surgical history on file.  There were no vitals filed for this visit.   Subjective Assessment - 07/30/21 1415     Subjective Pt reports going on a mission trip last week and hiking a lot. He hiked 18 miles without experiencing increased knee pain and several other days of 20,000+ steps including going upstairs.    Pertinent History Pt is a 20 y.o. male referred to PT for acute to subacute R knee pain that has been going on for the last few weeks. No pertinent PMH. Pain located on lateral aspect of his knee described as sharp that is worsened with weightbearing, knee AROM, and running. Unable to run > 1/4 mile due to pain. Popping of R knee reported but no locking or giving way. Has imaging of R knee with no concerns of fractures or other abnormalities. Pt initially denies previous hx of knee pain. Pain began May 8th training for half marathon when running ~8 miles. Pt reports running with R foot externally rotated and is wondering if his running mechanics is effecting it. Rest has been helpful and pain has been improving. When pain was at its worst, stopped running and elevated which helped. Pain located on lat aspect outside of patella. Described as  sharp pain during swing phase of gait and terminal stance with push off. Worst pain: 8/10, current pain: 0/10, best pain:  0/10. Pt reports taking close to whole day to reduce from 8/10. Painful movements also with lateral lunges and movements. Pt's main goal with PT is to find exercises above and below knee.    Limitations Standing    How long can you sit comfortably? Unlimited    How long can you stand comfortably? As long as needed    How long can you walk comfortably? Unlimited    Diagnostic tests X ray    Patient Stated Goals To be able to complete sports exercises and running long distances without experiencing pain.    Currently in Pain? No/denies    Pain Onset More than a month ago             THEREX:  Frisbee cutting drills: Zig Zags, Cut in and out, and Cut Left and Right   -No increase in pain reported                         PT Education - 07/30/21 1455     Education Details form/technique for exercise    Person(s) Educated Patient    Methods Explanation;Demonstration;Verbal cues    Comprehension Verbalized understanding;Returned demonstration;Verbal cues required  PT Short Term Goals - 07/30/21 1458       PT SHORT TERM GOAL #1   Title Pt will be indep with HEP to improve flexibility and pain with running.    Baseline 6/7: initiated    Time 4    Period Weeks    Status Achieved    Target Date 06/26/21      PT SHORT TERM GOAL #2   Title Pt will complete 1 mile of running without experiencing knee pain > 2/10 NPS.    Baseline 6/7: Up to 8/10 NPS with running 7/20: 2.5/10    Time 2    Period Weeks    Status Partially Met    Target Date 06/12/21               PT Long Term Goals - 07/30/21 1458       PT LONG TERM GOAL #1   Title Pt will improve FOTO to target score to display improvements in functional mobility.    Baseline 6/7: 65/86 7/6: 65/86 7/20: 86/86    Time 8    Period Weeks    Status New      PT  LONG TERM GOAL #2   Title Pt will complete 4 miles of running without experiencing an increase in R knee pain.    Baseline 6/7: Up to 8/10 pain NPS. 7/7: Has not attempted running 7/20: 1 mi 2.5/10    Time 8    Period Weeks    Status New                   Plan - 07/30/21 1456     Clinical Impression Statement Pt presents for f/u for R knee pain 2/2 patellar tendinopathy. Pt demonstrates no increase in knee pain even after return to sport activity with frisbee cutting drills. Pt will benefit from one more skilled PT apt to determine whether he can consistantly report no pain or reduced pain with return to sport activity to resume running and ultimate frisbee play.    Personal Factors and Comorbidities Age;Education;Profession;Past/Current Experience;Fitness;Time since onset of injury/illness/exacerbation    Examination-Activity Limitations Locomotion Level   Running   Examination-Participation Restrictions School;Occupation    Stability/Clinical Decision Making Stable/Uncomplicated    Rehab Potential Good    PT Frequency 2x / week    PT Duration 8 weeks    PT Treatment/Interventions ADLs/Self Care Home Management;Aquatic Therapy;Cryotherapy;Electrical Stimulation;Iontophoresis 43m/ml Dexamethasone;Moist Heat;Traction;Ultrasound;Gait training;Stair training;Functional mobility training;Therapeutic activities;Patient/family education;Therapeutic exercise;Balance training;Neuromuscular re-education;Passive range of motion;Dry needling;Manual techniques;Spinal Manipulations;Joint Manipulations    PT Next Visit Plan Ultimate Frisbee Cutting Drills    PT Home Exercise Plan Continue plyometric activity. Access Code: [email or web address](Instructed pt to perform 30 min of single leg plyometrics for 3 days per week)    Consulted and Agree with Plan of Care Patient             Patient will benefit from skilled therapeutic intervention in order to improve the following deficits and impairments:   Pain, Improper body mechanics, Decreased mobility, Impaired flexibility  Visit Diagnosis: Acute pain of right knee     Problem List There are no problems to display for this patient.  DBradly ChrisPT, DPT  07/30/2021, 3:00 PM  [facility] [address] [place] [address]Phone: [phone]  Fax:  [phone] Name: Jonathan PUNTMRN: [id number] of Birth: 701-28-2002

## 2021-08-07 ENCOUNTER — Ambulatory Visit: Payer: 59 | Admitting: Physical Therapy

## 2021-08-09 ENCOUNTER — Ambulatory Visit: Payer: 59 | Admitting: Physical Therapy

## 2021-08-09 ENCOUNTER — Encounter: Payer: 59 | Admitting: Physical Therapy

## 2021-08-09 DIAGNOSIS — M25561 Pain in right knee: Secondary | ICD-10-CM

## 2021-08-09 NOTE — Therapy (Signed)
[facility] [address], [place], [address] Phone: [phone]   Fax:  [phone]  August 09, 2021   No Recipients  Physical Therapy Discharge Summary  Patient: Jonathan Solis  MRN: [id number]  Date of Birth: 2001-01-31   Diagnosis: Acute pain of right knee Referring Provider (PT): Tamala Julian  Episode of Care: 05/29/21-08/09/21  The above patient had been seen in Physical Therapy 16 times of 16 treatments scheduled with 0 no shows and 2 cancellations.  The patient is: Improved  Subjective:    Subjective Assessment - 08/09/21 0937     Subjective Pt reports that he has been able to regularly engage in his exercise without an increase in his symptoms. He feels that he is ready to discharge and he wants to know what general exercise plan he should follow to avoid becoming injured in the future.    Pertinent History Pt is a 20 y.o. male referred to PT for acute to subacute R knee pain that has been going on for the last few weeks. No pertinent PMH. Pain located on lateral aspect of his knee described as sharp that is worsened with weightbearing, knee AROM, and running. Unable to run > 1/4 mile due to pain. Popping of R knee reported but no locking or giving way. Has imaging of R knee with no concerns of fractures or other abnormalities. Pt initially denies previous hx of knee pain. Pain began May 8th training for half marathon when running ~8 miles. Pt reports running with R foot externally rotated and is wondering if his running mechanics is effecting it. Rest has been helpful and pain has been improving. When pain was at its worst, stopped running and elevated which helped. Pain located on lat aspect outside of patella. Described as sharp pain during swing phase of gait and terminal stance with push off. Worst pain: 8/10, current pain: 0/10, best pain:  0/10. Pt reports taking close to whole day to reduce from  8/10. Painful movements also with lateral lunges and movements. Pt's main goal with PT is to find exercises above and below knee.    Limitations Standing    How long can you sit comfortably? Unlimited    How long can you stand comfortably? As long as needed    How long can you walk comfortably? Unlimited    Diagnostic tests X ray    Patient Stated Goals To be able to complete sports exercises and running long distances without experiencing pain.    Currently in Pain? No/denies    Pain Onset More than a month ago              Discharge Findings:  All Goals Met   PT Short Term Goals - 08/09/21 1034       PT SHORT TERM GOAL #1   Title Pt will be indep with HEP to improve flexibility and pain with running.    Baseline 6/7: initiated    Time 4    Period Weeks    Status Achieved    Target Date 06/26/21      PT SHORT TERM GOAL #2   Title Pt will complete 1 mile of running without experiencing knee pain > 2/10 NPS.    Baseline 6/7: Up to 8/10 NPS with running 7/20: 2.5/10    Time 2    Period Weeks    Status Partially Met    Target Date 06/12/21  PT Long Term Goals - 08/09/21 1041       PT LONG TERM GOAL #1   Title Pt will improve FOTO to target score to display improvements in functional mobility.    Baseline 6/7: 65/86 7/6: 65/86 7/20: 86/86 8/18: 98/86    Time 8    Period Weeks    Status Achieved      PT LONG TERM GOAL #2   Title Pt will complete 4 miles of running without experiencing an increase in R knee pain.    Baseline 6/7: Up to 8/10 pain NPS. 7/7: Has not attempted running 7/20: 1 mi 2.5/10 8/18: Ran 4 mi over weekend and reports no increased pain    Time 8    Period Weeks    Status Achieved               Plan - 08/09/21 1030     Clinical Impression Statement Pt presents for final apt for R knee pain 2/2 patellar tendinopathy. Pt consistently demonstrates no knee pain even after running and plyometric activity. PT educated pt on  continued exercise and conditioning to meet his goals to engage in endurance sports such as running and ultimate frisbee. Pt has met all his goals at this point, and he is ready to be discharged.    Personal Factors and Comorbidities Age;Education;Profession;Past/Current Experience;Fitness;Time since onset of injury/illness/exacerbation    Examination-Activity Limitations Locomotion Level   Running   Examination-Participation Restrictions School;Occupation    Stability/Clinical Decision Making Stable/Uncomplicated    Rehab Potential Good    PT Frequency 2x / week    PT Duration 8 weeks    PT Treatment/Interventions ADLs/Self Care Home Management;Aquatic Therapy;Cryotherapy;Electrical Stimulation;Iontophoresis 33m/ml Dexamethasone;Moist Heat;Traction;Ultrasound;Gait training;Stair training;Functional mobility training;Therapeutic activities;Patient/family education;Therapeutic exercise;Balance training;Neuromuscular re-education;Passive range of motion;Dry needling;Manual techniques;Spinal Manipulations;Joint Manipulations    PT Next Visit Plan N/a    PT Home Exercise Plan N/a    Recommended Other Services High repitition low weight strengthening exercise with super set. Exercises that mimic sports activity    Consulted and Agree with Plan of Care Patient             Sincerely,  DBradly ChrisPT, DPT     CC No Recipients  [facility] [address] [place] [address]Phone: [phone]  Fax:  [phone] Patient: Jonathan Solis MRN: [id number] Date of Birth: 7May 15, 2002

## 2021-08-14 ENCOUNTER — Encounter: Payer: 59 | Admitting: Physical Therapy

## 2021-08-16 ENCOUNTER — Encounter: Payer: 59 | Admitting: Physical Therapy

## 2021-08-21 ENCOUNTER — Encounter: Payer: 59 | Admitting: Physical Therapy

## 2022-12-24 DIAGNOSIS — Z0000 Encounter for general adult medical examination without abnormal findings: Secondary | ICD-10-CM | POA: Diagnosis not present

## 2022-12-24 DIAGNOSIS — Z832 Family history of diseases of the blood and blood-forming organs and certain disorders involving the immune mechanism: Secondary | ICD-10-CM | POA: Diagnosis not present

## 2023-03-05 DIAGNOSIS — R0789 Other chest pain: Secondary | ICD-10-CM | POA: Diagnosis not present

## 2023-12-26 DIAGNOSIS — Z1331 Encounter for screening for depression: Secondary | ICD-10-CM | POA: Diagnosis not present

## 2023-12-26 DIAGNOSIS — Z0001 Encounter for general adult medical examination with abnormal findings: Secondary | ICD-10-CM | POA: Diagnosis not present

## 2023-12-26 DIAGNOSIS — Z0000 Encounter for general adult medical examination without abnormal findings: Secondary | ICD-10-CM | POA: Diagnosis not present

## 2023-12-26 DIAGNOSIS — Z1339 Encounter for screening examination for other mental health and behavioral disorders: Secondary | ICD-10-CM | POA: Diagnosis not present

## 2024-12-08 DIAGNOSIS — J40 Bronchitis, not specified as acute or chronic: Secondary | ICD-10-CM | POA: Diagnosis not present

## 2024-12-08 DIAGNOSIS — J019 Acute sinusitis, unspecified: Secondary | ICD-10-CM | POA: Diagnosis not present
